# Patient Record
Sex: Female | Born: 1990 | Race: White | Hispanic: No | Marital: Married | Smoking: Never smoker
Health system: Southern US, Community
[De-identification: ages and names within clinical notes are randomized; demographics above are authoritative.]

## PROBLEM LIST (undated history)

## (undated) DIAGNOSIS — G4700 Insomnia, unspecified: Secondary | ICD-10-CM

## (undated) DIAGNOSIS — F429 Obsessive-compulsive disorder, unspecified: Secondary | ICD-10-CM

## (undated) DIAGNOSIS — R51 Headache: Secondary | ICD-10-CM

## (undated) DIAGNOSIS — F419 Anxiety disorder, unspecified: Secondary | ICD-10-CM

## (undated) HISTORY — DX: Headache: R51

## (undated) HISTORY — PX: WISDOM TOOTH EXTRACTION: SHX21

## (undated) HISTORY — DX: Anxiety disorder, unspecified: F41.9

## (undated) HISTORY — DX: Insomnia, unspecified: G47.00

## (undated) HISTORY — DX: Obsessive-compulsive disorder, unspecified: F42.9

## (undated) HISTORY — PX: TONSILLECTOMY: SHX5217

---

## 2012-07-14 ENCOUNTER — Encounter: Payer: Self-pay | Admitting: *Deleted

## 2012-07-18 ENCOUNTER — Encounter: Payer: Self-pay | Admitting: *Deleted

## 2012-07-27 ENCOUNTER — Ambulatory Visit: Payer: Self-pay | Admitting: Family Medicine

## 2012-07-27 ENCOUNTER — Ambulatory Visit (INDEPENDENT_AMBULATORY_CARE_PROVIDER_SITE_OTHER): Payer: PRIVATE HEALTH INSURANCE | Admitting: Family Medicine

## 2012-07-27 VITALS — BP 113/74 | HR 111 | Ht 61.0 in | Wt 94.0 lb

## 2012-07-27 DIAGNOSIS — F520 Hypoactive sexual desire disorder: Secondary | ICD-10-CM

## 2012-07-27 DIAGNOSIS — F411 Generalized anxiety disorder: Secondary | ICD-10-CM

## 2012-07-27 DIAGNOSIS — F429 Obsessive-compulsive disorder, unspecified: Secondary | ICD-10-CM

## 2012-07-27 MED ORDER — SERTRALINE HCL 50 MG PO TABS: 50.0000 mg | ORAL_TABLET | Freq: Every day | ORAL | Status: DC

## 2012-07-27 MED ORDER — ESCITALOPRAM OXALATE 10 MG PO TABS: 10.0000 mg | ORAL_TABLET | Freq: Every day | ORAL | Status: DC

## 2012-07-27 MED ORDER — ESCITALOPRAM 5 MG HALF TABLET: ORAL_TABLET | ORAL | Status: DC

## 2012-07-27 MED ORDER — BUPROPION HCL ER (XL) 150 MG PO TB24: 150.0000 mg | ORAL_TABLET | ORAL | Status: DC

## 2012-07-27 NOTE — Progress Notes (Signed)
Subjective:    Patient ID: Michelle [name], female    DOB: 05-25-1990, 22 y.o.   MRN: [id number]  HPI Michelle [name] is here today with her husband [name] to discuss her anxiety.  She was previously on Zoloft and did not like the sexual side effects so she stopped it and became very anxious.  She was then switched to Buspar which she definitely did not like so she went back on Zoloft.  She is currently taking 100 mg of Zoloft which helps her anxiety but her sexual dysfunction is back which she does not like/want.  She continues to be very worried/obsessed about exposure to blood.      Review of Systems  Constitutional: Negative.   HENT: Negative.   Eyes: Negative.   Respiratory: Negative.   Cardiovascular: Negative.   Gastrointestinal: Negative.   Genitourinary: Positive for genital sores.  Psychiatric/Behavioral: The patient is nervous/anxious.        Low Sexual Desire     Past Medical History  Diagnosis Date  . Anxiety   . Obsessive compulsive disorder   . Insomnia   . Headache    Family History  Problem Relation Age of Onset  . Depression Mother   . Anxiety disorder Mother   . Hyperlipidemia Father   . Hypertension Father   . Cancer Paternal Aunt 73    Colon Cancer   . Breast cancer Paternal Aunt   . Breast cancer Paternal Grandmother   . Cancer Paternal Grandfather     Leukemia   . 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 No distress.  HENT:  Head: Normocephalic.  Eyes: No scleral icterus.  Neck: No thyromegaly present.   Cardiovascular: Normal rate, regular rhythm and normal heart sounds.   Pulmonary/Chest: Effort normal and breath sounds normal.  Abdominal: There is no tenderness.  Musculoskeletal: She exhibits no edema and no tenderness.  Neurological: She is alert.  Skin: Skin is warm and dry.  Psychiatric: Her behavior is normal. Judgment normal.  She continues to be very worried about touching anything at work that she feels might be dried blood.  She remains very concerned that she might contract HIV/Hepatitis.        Assessment & Plan:   1)  Anxiety - We had a long discussion about her anxiety and her obsessions.  Her husband [name]) says that he certainly could tell a difference with her being on the Zoloft vs the Buspar which did not work well for her at all.  We are going to wean her down from Zoloft 100 mg and get her up to Lexapro 10 mg.    2)  Sex Drive -  She will address her sex drive after she is on the Lexapro for 2 weeks. If it continues to be low then she will add the Wellbutrin 150 mg in the am.    TIME 30 MINUTES:  MORE THAN 50 % OF TIME  WAS INVOLVED IN COUNSELING.

## 2012-07-27 NOTE — Patient Instructions (Addendum)
1)  Anxiety - Take 50 mg of Zoloft and 5 mg of Lexapro for one month then switch to 10 mg of Lexapro and stop the Zoloft.    2)  Sex Drive -  After you have been on the Lexapro 10 mg for 2 weeks then you can add the Wellbutrin 150 mg in the am.      Obsessive-Compulsive Disorder Obsessive-compulsive behavior is an anxiety disorder. The patient is constantly troubled by ideas that stay in the mind that cannot be ignored (obsessions). These troubling and sometimes bizarre thoughts compel one to behave in an unreasonable way. The patient will carry out repetitive, ritualistic acts (compulsions) to reduce anxiety. CAUSES The cause of obsessive-compulsive disorder (OCD) is not known. Scientists do know it runs in families. Men begin experiencing it in the teenage years. Women usually begin getting problems (symptoms) in their early 20's. Some studies have shown that the functioning of parts of the brain are different in those with OCD. The disorder may be closely associated with depression. SYMPTOMS  Persons with OCD recognize that their obsessions or compulsions prevent them from living a normal life. They commonly describe their behavior as foolish or pointless. But they cannot change it. Persons with OCD usually feel that their thoughts or obsessions are strange. They do not understand why they are having them. Sometimes the thoughts have to do with a fear that something terrible will happen or that they will do something terrible. Persons with OCD may spend hours each day performing senseless compulsive acts. The amount of time spent is less important than the degree of disruption caused in everyday life. EXAMPLES OF TYPICAL RITUALS  Cleaning. Fearing germs, a person may shower repeatedly throughout the entire day or wash his or her hands until they bleed.  Repeating. To dispel anxiety, a person may repeat a name or phrase many times.  Completing. A person may perform a series of complicated steps  in an exact order or repeat them until they are done perfectly.  Checking. A person may check things over and over to make sure a task has been completed. For example, repeatedly checking to see if the door is locked or the toaster is unplugged.  Hoarding. A person may constantly collect useless items that he or she repeatedly counts and stacks.  People with OCD may have emotional symptoms associated with depression. This may include guilt, low self-esteem, anxiety, and extreme fatigue. Many of these emotional problems are a result of the frustration brought on by an obsessive-compulsive problem.  Obsessive-compulsive symptoms will often create problems in relationships.  In extreme cases, people with OCD become totally disabled, have no friends, and are unable to leave home surroundings. They spend the day performing rituals or having obsessive thoughts. DIAGNOSIS  There is no laboratory test for OCD. It is diagnosed by your caregiver talking with you and someone close to you about your symptoms. Your caregiver will ask very specific questions about the type of obsessions or compulsions you have.   Your caregiver may diagnose obsessive-compulsive disorder if your obsessions or compulsions:  Cause you distress.  Take more than an hour of your time per day.  Interfere with your normal routine, occupation, social activities, or relationships with others. YOUR CAREGIVER MAY ASK YOU SUCH QUESTIONS AS:  Do you have troubling thoughts that you cannot dispel regardless of how hard you try?  Do you keep things extremely clean and wash your hands more than other people you know?  Do you  check things over and over, even though you know that the oven has been turned off or that the front door is locked? TREATMENT  A combination of antidepressant or anti-anxiety drugs and behavior therapy has been most helpful in treating the disorder. Clomipramine (Anafranil) is often used in the treatment of OCD.  Some drugs like Zoloft and Luvox have been used with positive results. In very rare cases, neurosurgery is performed. OCD is not obsession about life's normal worries. Your caregiver will have to make sure that a medication or drug is not adding to your symptoms. Phobias and longstanding (chronic) depression can also occur along with OCD.  HOW LONG WILL THE EFFECTS OF OBSESSIVE-COMPULSIVE DISORDER LAST? Obsessive-compulsive disorder usually appears in the late teens and early twenties. The disorder may last a lifetime without treatment. It may become less severe from time to time, but never quite goes away. You may become free of your symptoms for years before having a relapse. Developments in behavior therapy and new medications are helping many people with OCD live productive lives. HOME CARE INSTRUCTIONS   Include your family in your therapy. You and your family may benefit from reading books, studying OCD, and attending support groups.  Take your medication as ordered by your caregiver.  Do not miss your behavioral therapy sessions.  Know that you are not alone. There are millions of people affected by OCD. There are national organizations devoted to helping people with this disorder. SEEK IMMEDIATE MEDICAL CARE IF:  You feel that any of your ideas or actions are slipping out of your control. Document Released: 03/10/2001 Document Revised: 06/08/2011 Document Reviewed: 11/22/2007 [facility] Patient Information 2013 [place], [place].

## 2012-07-29 ENCOUNTER — Ambulatory Visit: Payer: Self-pay | Admitting: Family Medicine

## 2012-08-02 ENCOUNTER — Encounter: Payer: Self-pay | Admitting: Family Medicine

## 2012-08-02 DIAGNOSIS — F429 Obsessive-compulsive disorder, unspecified: Secondary | ICD-10-CM | POA: Insufficient documentation

## 2012-08-02 DIAGNOSIS — F520 Hypoactive sexual desire disorder: Secondary | ICD-10-CM | POA: Insufficient documentation

## 2012-08-02 DIAGNOSIS — F411 Generalized anxiety disorder: Secondary | ICD-10-CM | POA: Insufficient documentation

## 2012-08-26 ENCOUNTER — Ambulatory Visit: Payer: Self-pay | Admitting: Family Medicine

## 2012-08-26 DIAGNOSIS — Z0289 Encounter for other administrative examinations: Secondary | ICD-10-CM

## 2012-10-25 ENCOUNTER — Encounter: Payer: Self-pay | Admitting: Family Medicine

## 2012-10-25 ENCOUNTER — Ambulatory Visit (INDEPENDENT_AMBULATORY_CARE_PROVIDER_SITE_OTHER): Payer: PRIVATE HEALTH INSURANCE | Admitting: Family Medicine

## 2012-10-25 VITALS — BP 129/83 | HR 128 | Resp 16 | Ht 61.5 in | Wt 89.0 lb

## 2012-10-25 DIAGNOSIS — Z3041 Encounter for surveillance of contraceptive pills: Secondary | ICD-10-CM

## 2012-10-25 DIAGNOSIS — L981 Factitial dermatitis: Secondary | ICD-10-CM

## 2012-10-25 DIAGNOSIS — F411 Generalized anxiety disorder: Secondary | ICD-10-CM

## 2012-10-25 DIAGNOSIS — L739 Follicular disorder, unspecified: Secondary | ICD-10-CM

## 2012-10-25 DIAGNOSIS — F520 Hypoactive sexual desire disorder: Secondary | ICD-10-CM

## 2012-10-25 DIAGNOSIS — L738 Other specified follicular disorders: Secondary | ICD-10-CM

## 2012-10-25 MED ORDER — NORGESTIM-ETH ESTRAD TRIPHASIC 0.18/0.215/0.25 MG-35 MCG PO TABS: 1.0000 | ORAL_TABLET | Freq: Every day | ORAL | Status: DC

## 2012-10-25 MED ORDER — MUPIROCIN CALCIUM 2 % EX CREA: TOPICAL_CREAM | CUTANEOUS | Status: DC

## 2012-10-25 MED ORDER — BUPROPION HCL ER (XL) 150 MG PO TB24: 150.0000 mg | ORAL_TABLET | ORAL | Status: DC

## 2012-10-25 MED ORDER — SERTRALINE HCL 100 MG PO TABS: 100.0000 mg | ORAL_TABLET | Freq: Every day | ORAL | Status: DC

## 2012-10-25 MED ORDER — PERMETHRIN 5 % EX CREA: TOPICAL_CREAM | Freq: Once | CUTANEOUS | Status: DC

## 2012-10-25 MED ORDER — AZITHROMYCIN 500 MG PO TABS: ORAL_TABLET | ORAL | Status: DC

## 2012-10-25 MED ORDER — BETAMETHASONE VALERATE 0.12 % EX FOAM: 1.0000 "application " | Freq: Two times a day (BID) | CUTANEOUS | Status: DC

## 2012-10-25 NOTE — Progress Notes (Signed)
  Subjective:    Patient ID: Michelle [name], female    DOB: 1990-11-02, 22 y.o.   MRN: [id number]  HPI  Michelle [name] is here today to discuss the conditions listed below:    1)  Vaginal Bleeding:  She has been experiencing vaginal bleeding after intercourse for several weeks.  She is very concerned about this problem.    2)  Anxiety:  She is doing much better on her current dose of Zoloft.  She needs this medication refilled.    Review of Systems  HENT: Negative.   Eyes: Negative.   Respiratory: Negative.   Cardiovascular: Negative.   Gastrointestinal: Negative.   Endocrine: Negative.   Genitourinary: Positive for vaginal bleeding.  Musculoskeletal: Negative.   Skin: Negative.   Allergic/Immunologic: Negative.   Neurological: Negative.   Hematological: Negative.   Psychiatric/Behavioral: Negative.     Past Medical History  Diagnosis Date  . Anxiety   . Obsessive compulsive disorder   . Insomnia   . Headache(784.0)     Family History  Problem Relation Age of Onset  . Depression Mother   . Anxiety disorder Mother   . Hyperlipidemia Father   . Hypertension Father   . Cancer Paternal Aunt 4    Colon Cancer   . Breast cancer Paternal Aunt   . Breast cancer Paternal Grandmother   . Cancer Paternal Grandfather     Leukemia   . 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 She appears well-nourished. No distress.  Eyes: Conjunctivae are normal. No scleral icterus.  Neck: Neck supple. No thyromegaly  present.  Cardiovascular: Normal rate, regular rhythm and normal heart sounds.   Pulmonary/Chest: Breath sounds normal. No respiratory distress.  Genitourinary: Uterus normal. There is no tenderness or lesion on the right labia. There is no tenderness or lesion on the left labia. Cervix exhibits no motion tenderness, no discharge and no friability. Right adnexum displays no mass, no tenderness and no fullness. Left adnexum displays no mass, no tenderness and no fullness. No erythema, tenderness or bleeding around the vagina. No vaginal discharge found.  Neurological: She is alert and oriented to person, place, and time.  Skin: Skin is warm and dry. Lesion noted.  Both of her lower legs are covered in sores.    Psychiatric:  She is anxious but less than she was at her last visit.            Assessment & Plan:

## 2012-10-25 NOTE — Patient Instructions (Addendum)
1)  Vaginal Beeding - This is most likely due to irritation.  We are treating you with a medication that will cover atypical bacteria that can cause cervicitis.    2)  Skin - Zithromax plus Luxiq.  You may also want to add some Zyrtec at night.  If you don't improve then you could try some Elimite .   3)  Birth Control Pills - The Ortho Tri Cyclen might have less effect on libido if you want to try this pill.  4)  Anxiety - Stay on the Zoloft. You can add the Wellbutrin which might also decrease the sexual dysfunction.      Folliculitis  Folliculitis is redness, soreness, and swelling (inflammation) of the hair follicles. This condition can occur anywhere on the body. People with weakened immune systems, diabetes, or obesity have a greater risk of getting folliculitis. CAUSES  Bacterial infection. This is the most common cause.  Fungal infection.  Viral infection.  Contact with certain chemicals, especially oils and tars. Long-term folliculitis can result from bacteria that live in the nostrils. The bacteria may trigger multiple outbreaks of folliculitis over time. SYMPTOMS Folliculitis most commonly occurs on the scalp, thighs, legs, back, buttocks, and areas where hair is shaved frequently. An early sign of folliculitis is a small, white or yellow, pus-filled, itchy lesion (pustule). These lesions appear on a red, inflamed follicle. They are usually less than 0.2 inches (5 mm) wide. When there is an infection of the follicle that goes deeper, it becomes a boil or furuncle. A group of closely packed boils creates a larger lesion (carbuncle). Carbuncles tend to occur in hairy, sweaty areas of the body. DIAGNOSIS  Your caregiver can usually tell what is wrong by doing a physical exam. A sample may be taken from one of the lesions and tested in a lab. This can help determine what is causing your folliculitis. TREATMENT  Treatment may include:  Applying warm compresses to the affected  areas.  Taking antibiotic medicines orally or applying them to the skin.  Draining the lesions if they contain a large amount of pus or fluid.  Laser hair removal for cases of long-lasting folliculitis. This helps to prevent regrowth of the hair. HOME CARE INSTRUCTIONS  Apply warm compresses to the affected areas as directed by your caregiver.  If antibiotics are prescribed, take them as directed. Finish them even if you start to feel better.  You may take over-the-counter medicines to relieve itching.  Do not shave irritated skin.  Follow up with your caregiver as directed. SEEK IMMEDIATE MEDICAL CARE IF:   You have increasing redness, swelling, or pain in the affected area.  You have a fever. MAKE SURE YOU:  Understand these instructions.  Will watch your condition.  Will get help right away if you are not doing well or get worse. Document Released: 05/25/2001 Document Revised: 09/15/2011 Document Reviewed: 06/16/2011 [facility] Patient Information 2014 [place], [place].

## 2012-12-04 DIAGNOSIS — Z3041 Encounter for surveillance of contraceptive pills: Secondary | ICD-10-CM | POA: Insufficient documentation

## 2012-12-04 DIAGNOSIS — L739 Follicular disorder, unspecified: Secondary | ICD-10-CM | POA: Insufficient documentation

## 2012-12-04 DIAGNOSIS — R6882 Decreased libido: Secondary | ICD-10-CM | POA: Insufficient documentation

## 2012-12-04 DIAGNOSIS — F520 Hypoactive sexual desire disorder: Secondary | ICD-10-CM | POA: Insufficient documentation

## 2012-12-04 DIAGNOSIS — L981 Factitial dermatitis: Secondary | ICD-10-CM | POA: Insufficient documentation

## 2012-12-04 NOTE — Assessment & Plan Note (Signed)
Michelle [name] admits to picking at her skin which is what I feel is going on with her labs.

## 2012-12-04 NOTE — Assessment & Plan Note (Deleted)
We'll see if Wellbutrin will counteract the sexual dysfunction that Zoloft causes.

## 2012-12-04 NOTE — Assessment & Plan Note (Signed)
We'll see if Wellbutrin will counteract the sexual dysfunction that Zoloft causes.   

## 2012-12-04 NOTE — Assessment & Plan Note (Signed)
She was given a prescription for Zithromax to help her cervicitis and Bactroban.

## 2012-12-04 NOTE — Assessment & Plan Note (Signed)
We'll switch her to Ortho Tri-Cyclen to see if this will decrease her sexual dysfunction.

## 2012-12-04 NOTE — Assessment & Plan Note (Signed)
Her anxiety is better on Zoloft.  She will remain on the 100 mg.

## 2013-01-26 ENCOUNTER — Encounter: Payer: Self-pay | Admitting: Family Medicine

## 2013-01-27 ENCOUNTER — Ambulatory Visit (INDEPENDENT_AMBULATORY_CARE_PROVIDER_SITE_OTHER): Payer: PRIVATE HEALTH INSURANCE | Admitting: Family Medicine

## 2013-01-27 ENCOUNTER — Other Ambulatory Visit (HOSPITAL_COMMUNITY)
Admission: RE | Admit: 2013-01-27 | Discharge: 2013-01-27 | Disposition: A | Discharge status: Home / Self Care | Payer: PRIVATE HEALTH INSURANCE | Source: Ambulatory Visit | Attending: Family Medicine | Admitting: Family Medicine

## 2013-01-27 ENCOUNTER — Encounter: Payer: Self-pay | Admitting: Family Medicine

## 2013-01-27 ENCOUNTER — Encounter (INDEPENDENT_AMBULATORY_CARE_PROVIDER_SITE_OTHER): Payer: Self-pay

## 2013-01-27 VITALS — BP 127/68 | HR 111 | Resp 18 | Ht 62.5 in | Wt 87.0 lb

## 2013-01-27 DIAGNOSIS — Z01419 Encounter for gynecological examination (general) (routine) without abnormal findings: Secondary | ICD-10-CM | POA: Insufficient documentation

## 2013-01-27 DIAGNOSIS — F520 Hypoactive sexual desire disorder: Secondary | ICD-10-CM

## 2013-01-27 DIAGNOSIS — Z3041 Encounter for surveillance of contraceptive pills: Secondary | ICD-10-CM

## 2013-01-27 DIAGNOSIS — F411 Generalized anxiety disorder: Secondary | ICD-10-CM

## 2013-01-27 DIAGNOSIS — Z0000 Encounter for general adult medical examination without abnormal findings: Secondary | ICD-10-CM

## 2013-01-27 DIAGNOSIS — Z124 Encounter for screening for malignant neoplasm of cervix: Secondary | ICD-10-CM

## 2013-01-27 LAB — POCT URINALYSIS DIPSTICK

## 2013-01-27 MED ORDER — NORGESTIM-ETH ESTRAD TRIPHASIC 0.18/0.215/0.25 MG-35 MCG PO TABS: 1.0000 | ORAL_TABLET | Freq: Every day | ORAL | Status: DC

## 2013-01-27 MED ORDER — BUPROPION HCL ER (XL) 150 MG PO TB24: 150.0000 mg | ORAL_TABLET | ORAL | Status: DC

## 2013-01-27 MED ORDER — SERTRALINE HCL 100 MG PO TABS: 100.0000 mg | ORAL_TABLET | Freq: Every day | ORAL | Status: DC

## 2013-01-27 NOTE — Patient Instructions (Signed)
Preventive Care for Adults, Female A healthy lifestyle and preventive care can promote health and wellness. Preventive health guidelines for women include the following key practices.  A routine yearly physical is a good way to check with your caregiver about your health and preventive screening. It is a chance to share any concerns and updates on your health, and to receive a thorough exam.  Visit your dentist for a routine exam and preventive care every 6 months. Brush your teeth twice a day and floss once a day. Good oral hygiene prevents tooth decay and gum disease.  The frequency of eye exams is based on your age, health, family medical history, use of contact lenses, and other factors. Follow your caregiver's recommendations for frequency of eye exams.  Eat a healthy diet. Foods like vegetables, fruits, whole grains, low-fat dairy products, and lean protein foods contain the nutrients you need without too many calories. Decrease your intake of foods high in solid fats, added sugars, and salt. Eat the right amount of calories for you.Get information about a proper diet from your caregiver, if necessary.  Regular physical exercise is one of the most important things you can do for your health. Most adults should get at least 150 minutes of moderate-intensity exercise (any activity that increases your heart rate and causes you to sweat) each week. In addition, most adults need muscle-strengthening exercises on 2 or more days a week.  Maintain a healthy weight. The body mass index (BMI) is a screening tool to identify possible weight problems. It provides an estimate of body fat based on height and weight. Your caregiver can help determine your BMI, and can help you achieve or maintain a healthy weight.For adults 20 years and older:  A BMI below 18.5 is considered underweight.  A BMI of 18.5 to 24.9 is normal.  A BMI of 25 to 29.9 is considered overweight.  A BMI of 30 and above is  considered obese.  Maintain normal blood lipids and cholesterol levels by exercising and minimizing your intake of saturated fat. Eat a balanced diet with plenty of fruit and vegetables. Blood tests for lipids and cholesterol should begin at age 20 and be repeated every 5 years. If your lipid or cholesterol levels are high, you are over 50, or you are at high risk for heart disease, you may need your cholesterol levels checked more frequently.Ongoing high lipid and cholesterol levels should be treated with medicines if diet and exercise are not effective.  If you smoke, find out from your caregiver how to quit. If you do not use tobacco, do not start.  If you are pregnant, do not drink alcohol. If you are breastfeeding, be very cautious about drinking alcohol. If you are not pregnant and choose to drink alcohol, do not exceed 1 drink per day. One drink is considered to be 12 ounces (355 mL) of beer, 5 ounces (148 mL) of wine, or 1.5 ounces (44 mL) of liquor.  Avoid use of street drugs. Do not share needles with anyone. Ask for help if you need support or instructions about stopping the use of drugs.  High blood pressure causes heart disease and increases the risk of stroke. Your blood pressure should be checked at least every 1 to 2 years. Ongoing high blood pressure should be treated with medicines if weight loss and exercise are not effective.  If you are 55 to 23 years old, ask your caregiver if you should take aspirin to prevent strokes.  Diabetes   screening involves taking a blood sample to check your fasting blood sugar level. This should be done once every 3 years, after age 45, if you are within normal weight and without risk factors for diabetes. Testing should be considered at a younger age or be carried out more frequently if you are overweight and have at least 1 risk factor for diabetes.  Breast cancer screening is essential preventive care for women. You should practice "breast  self-awareness." This means understanding the normal appearance and feel of your breasts and may include breast self-examination. Any changes detected, no matter how small, should be reported to a caregiver. Women in their 20s and 30s should have a clinical breast exam (CBE) by a caregiver as part of a regular health exam every 1 to 3 years. After age 40, women should have a CBE every year. Starting at age 40, women should consider having a mammography (breast X-ray test) every year. Women who have a family history of breast cancer should talk to their caregiver about genetic screening. Women at a high risk of breast cancer should talk to their caregivers about having magnetic resonance imaging (MRI) and a mammography every year.  The Pap test is a screening test for cervical cancer. A Pap test can show cell changes on the cervix that might become cervical cancer if left untreated. A Pap test is a procedure in which cells are obtained and examined from the lower end of the uterus (cervix).  Women should have a Pap test starting at age 21.  Between ages 21 and 29, Pap tests should be repeated every 2 years.  Beginning at age 30, you should have a Pap test every 3 years as long as the past 3 Pap tests have been normal.  Some women have medical problems that increase the chance of getting cervical cancer. Talk to your caregiver about these problems. It is especially important to talk to your caregiver if a new problem develops soon after your last Pap test. In these cases, your caregiver may recommend more frequent screening and Pap tests.  The above recommendations are the same for women who have or have not gotten the vaccine for human papillomavirus (HPV).  If you had a hysterectomy for a problem that was not cancer or a condition that could lead to cancer, then you no longer need Pap tests. Even if you no longer need a Pap test, a regular exam is a good idea to make sure no other problems are  starting.  If you are between ages 65 and 70, and you have had normal Pap tests going back 10 years, you no longer need Pap tests. Even if you no longer need a Pap test, a regular exam is a good idea to make sure no other problems are starting.  If you have had past treatment for cervical cancer or a condition that could lead to cancer, you need Pap tests and screening for cancer for at least 20 years after your treatment.  If Pap tests have been discontinued, risk factors (such as a new sexual partner) need to be reassessed to determine if screening should be resumed.  The HPV test is an additional test that may be used for cervical cancer screening. The HPV test looks for the virus that can cause the cell changes on the cervix. The cells collected during the Pap test can be tested for HPV. The HPV test could be used to screen women aged 30 years and older, and should   be used in women of any age who have unclear Pap test results. After the age of 30, women should have HPV testing at the same frequency as a Pap test.  Colorectal cancer can be detected and often prevented. Most routine colorectal cancer screening begins at the age of 50 and continues through age 75. However, your caregiver may recommend screening at an earlier age if you have risk factors for colon cancer. On a yearly basis, your caregiver may provide home test kits to check for hidden blood in the stool. Use of a small camera at the end of a tube, to directly examine the colon (sigmoidoscopy or colonoscopy), can detect the earliest forms of colorectal cancer. Talk to your caregiver about this at age 50, when routine screening begins. Direct examination of the colon should be repeated every 5 to 10 years through age 75, unless early forms of pre-cancerous polyps or small growths are found.  Hepatitis C blood testing is recommended for all people born from 1945 through 1965 and any individual with known risks for hepatitis C.  Practice  safe sex. Use condoms and avoid high-risk sexual practices to reduce the spread of sexually transmitted infections (STIs). STIs include gonorrhea, chlamydia, syphilis, trichomonas, herpes, HPV, and human immunodeficiency virus (HIV). Herpes, HIV, and HPV are viral illnesses that have no cure. They can result in disability, cancer, and death. Sexually active women aged 25 and younger should be checked for chlamydia. Older women with new or multiple partners should also be tested for chlamydia. Testing for other STIs is recommended if you are sexually active and at increased risk.  Osteoporosis is a disease in which the bones lose minerals and strength with aging. This can result in serious bone fractures. The risk of osteoporosis can be identified using a bone density scan. Women ages 65 and over and women at risk for fractures or osteoporosis should discuss screening with their caregivers. Ask your caregiver whether you should take a calcium supplement or vitamin D to reduce the rate of osteoporosis.  Menopause can be associated with physical symptoms and risks. Hormone replacement therapy is available to decrease symptoms and risks. You should talk to your caregiver about whether hormone replacement therapy is right for you.  Use sunscreen with sun protection factor (SPF) of 30 or more. Apply sunscreen liberally and repeatedly throughout the day. You should seek shade when your shadow is shorter than you. Protect yourself by wearing long sleeves, pants, a wide-brimmed hat, and sunglasses year round, whenever you are outdoors.  Once a month, do a whole body skin exam, using a mirror to look at the skin on your back. Notify your caregiver of new moles, moles that have irregular borders, moles that are larger than a pencil eraser, or moles that have changed in shape or color.  Stay current with required immunizations.  Influenza. You need a dose every fall (or winter). The composition of the flu vaccine  changes each year, so being vaccinated once is not enough.  Pneumococcal polysaccharide. You need 1 to 2 doses if you smoke cigarettes or if you have certain chronic medical conditions. You need 1 dose at age 65 (or older) if you have never been vaccinated.  Tetanus, diphtheria, pertussis (Tdap, Td). Get 1 dose of Tdap vaccine if you are younger than age 65, are over 65 and have contact with an infant, are a healthcare worker, are pregnant, or simply want to be protected from whooping cough. After that, you need a Td   booster dose every 10 years. Consult your caregiver if you have not had at least 3 tetanus and diphtheria-containing shots sometime in your life or have a deep or dirty wound.  HPV. You need this vaccine if you are a woman age 26 or younger. The vaccine is given in 3 doses over 6 months.  Measles, mumps, rubella (MMR). You need at least 1 dose of MMR if you were born in 1957 or later. You may also need a second dose.  Meningococcal. If you are age 19 to 21 and a first-year college student living in a residence hall, or have one of several medical conditions, you need to get vaccinated against meningococcal disease. You may also need additional booster doses.  Zoster (shingles). If you are age 60 or older, you should get this vaccine.  Varicella (chickenpox). If you have never had chickenpox or you were vaccinated but received only 1 dose, talk to your caregiver to find out if you need this vaccine.  Hepatitis A. You need this vaccine if you have a specific risk factor for hepatitis A virus infection or you simply wish to be protected from this disease. The vaccine is usually given as 2 doses, 6 to 18 months apart.  Hepatitis B. You need this vaccine if you have a specific risk factor for hepatitis B virus infection or you simply wish to be protected from this disease. The vaccine is given in 3 doses, usually over 6 months. Preventive Services / Frequency Ages 19 to 39  Blood  pressure check.** / Every 1 to 2 years.  Lipid and cholesterol check.** / Every 5 years beginning at age 20.  Clinical breast exam.** / Every 3 years for women in their 20s and 30s.  Pap test.** / Every 2 years from ages 21 through 29. Every 3 years starting at age 30 through age 65 or 70 with a history of 3 consecutive normal Pap tests.  HPV screening.** / Every 3 years from ages 30 through ages 65 to 70 with a history of 3 consecutive normal Pap tests.  Hepatitis C blood test.** / For any individual with known risks for hepatitis C.  Skin self-exam. / Monthly.  Influenza immunization.** / Every year.  Pneumococcal polysaccharide immunization.** / 1 to 2 doses if you smoke cigarettes or if you have certain chronic medical conditions.  Tetanus, diphtheria, pertussis (Tdap, Td) immunization. / A one-time dose of Tdap vaccine. After that, you need a Td booster dose every 10 years.  HPV immunization. / 3 doses over 6 months, if you are 26 and younger.  Measles, mumps, rubella (MMR) immunization. / You need at least 1 dose of MMR if you were born in 1957 or later. You may also need a second dose.  Meningococcal immunization. / 1 dose if you are age 19 to 21 and a first-year college student living in a residence hall, or have one of several medical conditions, you need to get vaccinated against meningococcal disease. You may also need additional booster doses.  Varicella immunization.** / Consult your caregiver.  Hepatitis A immunization.** / Consult your caregiver. 2 doses, 6 to 18 months apart.  Hepatitis B immunization.** / Consult your caregiver. 3 doses usually over 6 months. Ages 40 to 64  Blood pressure check.** / Every 1 to 2 years.  Lipid and cholesterol check.** / Every 5 years beginning at age 20.  Clinical breast exam.** / Every year after age 40.  Mammogram.** / Every year beginning at age 40   and continuing for as long as you are in good health. Consult with your  caregiver.  Pap test.** / Every 3 years starting at age 30 through age 65 or 70 with a history of 3 consecutive normal Pap tests.  HPV screening.** / Every 3 years from ages 30 through ages 65 to 70 with a history of 3 consecutive normal Pap tests.  Fecal occult blood test (FOBT) of stool. / Every year beginning at age 50 and continuing until age 75. You may not need to do this test if you get a colonoscopy every 10 years.  Flexible sigmoidoscopy or colonoscopy.** / Every 5 years for a flexible sigmoidoscopy or every 10 years for a colonoscopy beginning at age 50 and continuing until age 75.  Hepatitis C blood test.** / For all people born from 1945 through 1965 and any individual with known risks for hepatitis C.  Skin self-exam. / Monthly.  Influenza immunization.** / Every year.  Pneumococcal polysaccharide immunization.** / 1 to 2 doses if you smoke cigarettes or if you have certain chronic medical conditions.  Tetanus, diphtheria, pertussis (Tdap, Td) immunization.** / A one-time dose of Tdap vaccine. After that, you need a Td booster dose every 10 years.  Measles, mumps, rubella (MMR) immunization. / You need at least 1 dose of MMR if you were born in 1957 or later. You may also need a second dose.  Varicella immunization.** / Consult your caregiver.  Meningococcal immunization.** / Consult your caregiver.  Hepatitis A immunization.** / Consult your caregiver. 2 doses, 6 to 18 months apart.  Hepatitis B immunization.** / Consult your caregiver. 3 doses, usually over 6 months. Ages 65 and over  Blood pressure check.** / Every 1 to 2 years.  Lipid and cholesterol check.** / Every 5 years beginning at age 20.  Clinical breast exam.** / Every year after age 40.  Mammogram.** / Every year beginning at age 40 and continuing for as long as you are in good health. Consult with your caregiver.  Pap test.** / Every 3 years starting at age 30 through age 65 or 70 with a 3  consecutive normal Pap tests. Testing can be stopped between 65 and 70 with 3 consecutive normal Pap tests and no abnormal Pap or HPV tests in the past 10 years.  HPV screening.** / Every 3 years from ages 30 through ages 65 or 70 with a history of 3 consecutive normal Pap tests. Testing can be stopped between 65 and 70 with 3 consecutive normal Pap tests and no abnormal Pap or HPV tests in the past 10 years.  Fecal occult blood test (FOBT) of stool. / Every year beginning at age 50 and continuing until age 75. You may not need to do this test if you get a colonoscopy every 10 years.  Flexible sigmoidoscopy or colonoscopy.** / Every 5 years for a flexible sigmoidoscopy or every 10 years for a colonoscopy beginning at age 50 and continuing until age 75.  Hepatitis C blood test.** / For all people born from 1945 through 1965 and any individual with known risks for hepatitis C.  Osteoporosis screening.** / A one-time screening for women ages 65 and over and women at risk for fractures or osteoporosis.  Skin self-exam. / Monthly.  Influenza immunization.** / Every year.  Pneumococcal polysaccharide immunization.** / 1 dose at age 65 (or older) if you have never been vaccinated.  Tetanus, diphtheria, pertussis (Tdap, Td) immunization. / A one-time dose of Tdap vaccine if you are over   65 and have contact with an infant, are a healthcare worker, or simply want to be protected from whooping cough. After that, you need a Td booster dose every 10 years.  Varicella immunization.** / Consult your caregiver.  Meningococcal immunization.** / Consult your caregiver.  Hepatitis A immunization.** / Consult your caregiver. 2 doses, 6 to 18 months apart.  Hepatitis B immunization.** / Check with your caregiver. 3 doses, usually over 6 months. ** Family history and personal history of risk and conditions may change your caregiver's recommendations. Document Released: 05/12/2001 Document Revised: 06/08/2011  Document Reviewed: 08/11/2010 [employer] Patient Information 2014 [employer].  

## 2013-01-27 NOTE — Progress Notes (Signed)
Subjective:    Patient ID: Michelle [name], female    DOB: Feb 09, 1991, 22 y.o.   MRN: [id number]  HPI  Michelle [name] is here today for her annual CPE with pap.  She also would like to discuss the condition listed below:    1)  Nasal Congestion:  She has been struggling with seasonal allergies.  She has not taken any medications for these symptoms.    2)  Birth Control:  She is doing much better with her norethindrone-ethynil estradiol.     3)  Mood:  She is doing well with the combination of Zoloft and Wellbutrin.     Review of Systems  Constitutional: Negative.   HENT: Negative.   Eyes: Negative.   Respiratory: Negative.   Cardiovascular: Negative.   Gastrointestinal: Negative.   Endocrine: Negative.   Genitourinary: Negative.   Musculoskeletal: Negative.   Skin: Negative.   Allergic/Immunologic: Negative.   Neurological: Negative.   Hematological: Negative.   Psychiatric/Behavioral: Negative.      Past Medical History  Diagnosis Date  . Anxiety   . Obsessive compulsive disorder   . Insomnia   . Headache(784.0)      Family History  Problem Relation Age of Onset  . Depression Mother   . Anxiety disorder Mother   . Hyperlipidemia Father   . Hypertension Father   . Cancer Paternal Aunt 95    Colon Cancer   . Breast cancer Paternal Aunt   . Breast cancer Paternal Grandmother   . Cancer Paternal Grandfather     Leukemia   . 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 Constitutional: She is oriented to person, place, and time. She appears  well-developed and well-nourished.  HENT:  Head: Normocephalic and atraumatic.  Right Ear: External ear normal.  Left Ear: External ear normal.  Nose: Nose normal.  Mouth/Throat: Oropharynx is clear and moist.  Eyes: Conjunctivae and EOM are normal. Pupils are equal, round, and reactive to light.  Neck: Normal range of motion. No thyromegaly present.  Cardiovascular: Normal rate, regular rhythm, normal heart sounds and intact distal pulses.  Exam reveals no gallop and no friction rub.   No murmur heard. Pulmonary/Chest: Effort normal and breath sounds normal. Right breast exhibits no inverted nipple, no mass, no nipple discharge, no skin change and no tenderness. Left breast exhibits no inverted nipple, no mass, no nipple discharge, no skin change and no tenderness. Breasts are symmetrical.  Abdominal: Soft. Bowel sounds are normal. Hernia confirmed negative in the right inguinal area and confirmed negative in the left inguinal area.  Genitourinary: Vagina normal and uterus normal. Pelvic exam was performed with patient supine. There is no rash, tenderness or lesion on the right labia. There is no rash, tenderness or lesion on the left labia. No vaginal discharge found.  Musculoskeletal: Normal range of motion. She exhibits no edema and no tenderness.  Lymphadenopathy:    She has no cervical adenopathy.  Right: No inguinal adenopathy present.       Left: No inguinal adenopathy present.  Neurological: She is alert and oriented to person, place, and time. She has normal reflexes.  Skin: Skin is warm and dry.  Psychiatric: She has a normal mood and affect. Her behavior is normal. Judgment and thought content normal.          Assessment & Plan:

## 2013-01-29 DIAGNOSIS — Z124 Encounter for screening for malignant neoplasm of cervix: Secondary | ICD-10-CM | POA: Insufficient documentation

## 2013-01-29 DIAGNOSIS — Z0000 Encounter for general adult medical examination without abnormal findings: Secondary | ICD-10-CM | POA: Insufficient documentation

## 2013-01-29 NOTE — Assessment & Plan Note (Signed)
Refilled her Zoloft  

## 2013-01-29 NOTE — Assessment & Plan Note (Signed)
We discussed that Ortho Tri-Cyclen might also help with her sex drive.  She will decide if she wants to stay on the Loestrin or try the Ortho Tri Cyclen.

## 2013-01-29 NOTE — Assessment & Plan Note (Signed)
Normal exam.

## 2013-01-29 NOTE — Assessment & Plan Note (Signed)
A pap was done without difficulty.  We're checking for high risk HPV with reflex to 16/18 if positive.   

## 2013-01-29 NOTE — Assessment & Plan Note (Signed)
This has improved since adding the Wellbutrin.  She will continue on the combination of Wellbutrin and Zoloft.

## 2013-01-30 ENCOUNTER — Encounter: Payer: Self-pay | Admitting: *Deleted

## 2013-03-17 ENCOUNTER — Encounter: Payer: Self-pay | Admitting: Family Medicine

## 2013-03-17 ENCOUNTER — Ambulatory Visit (INDEPENDENT_AMBULATORY_CARE_PROVIDER_SITE_OTHER): Payer: PRIVATE HEALTH INSURANCE | Admitting: Family Medicine

## 2013-03-17 VITALS — BP 111/74 | HR 125 | Resp 16 | Wt 88.0 lb

## 2013-03-17 DIAGNOSIS — F520 Hypoactive sexual desire disorder: Secondary | ICD-10-CM

## 2013-03-17 DIAGNOSIS — Z3041 Encounter for surveillance of contraceptive pills: Secondary | ICD-10-CM

## 2013-03-17 DIAGNOSIS — F411 Generalized anxiety disorder: Secondary | ICD-10-CM

## 2013-03-17 MED ORDER — NORETHIN ACE-ETH ESTRAD-FE 1-20 MG-MCG PO TABS: 1.0000 | ORAL_TABLET | Freq: Every day | ORAL | Status: DC

## 2013-03-17 MED ORDER — SERTRALINE HCL 100 MG PO TABS: 100.0000 mg | ORAL_TABLET | Freq: Every day | ORAL | Status: DC

## 2013-03-17 MED ORDER — BUPROPION HCL ER (XL) 150 MG PO TB24: 150.0000 mg | ORAL_TABLET | ORAL | Status: DC

## 2013-03-17 NOTE — Progress Notes (Signed)
   Subjective:    Patient ID: Michelle [name], female    DOB: January 05, 1991, 22 y.o.   MRN: [id number]  HPI  [name] is here today to discuss her current treatment for anxiety.  She was doing well on the combination of Zoloft and Wellbutrin but she feels that her anxiety level has increased since she switched her birth control.  She also feels that her menstrual cycles is heavier and her cramps are also worse so she would like to get back on Gildess which she feels worked better for her.     Review of Systems  Genitourinary: Positive for menstrual problem.  Psychiatric/Behavioral: The patient is nervous/anxious.      Past Medical History  Diagnosis Date  . Anxiety   . Obsessive compulsive disorder   . Insomnia   . NWGNFAOZ(308.6)      Past Surgical History  Procedure Laterality Date  . 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 Depression Mother   . Anxiety disorder Mother   . Hyperlipidemia Father   . Hypertension Father   . Cancer Paternal Aunt 63    Colon Cancer   . Breast cancer Paternal Aunt   . Breast cancer Paternal Grandmother   . Cancer Paternal Grandfather     Leukemia   . Hypertension Paternal Grandfather   . Drug abuse Paternal Uncle      No Known Allergies      Objective:   Physical Exam  Vitals reviewed. Constitutional: She is oriented to person, place, and time. She appears well-nourished. No distress.  Eyes: Conjunctivae are normal. No scleral icterus.  Neck: Neck supple. No thyromegaly present.  Cardiovascular: Normal rate, regular  rhythm and normal heart sounds.   Pulmonary/Chest: Breath sounds normal. No respiratory distress.  Neurological: She is alert and oriented to person, place, and time.  Skin: Skin is warm and dry.  Psychiatric: She has a normal mood and affect. Her behavior is normal. Judgment and thought content normal.  Anxious      Assessment & Plan:    Keniesha was seen today for anxiety.  Diagnoses and associated orders for this visit:  Uses oral contraception - norethindrone-ethinyl estradiol (JUNEL FE,GILDESS FE,LOESTRIN FE) 1-20 MG-MCG tablet; Take 1 tablet by mouth daily.  Anxiety state, unspecified - sertraline (ZOLOFT) 100 MG tablet; Take 1 tablet (100 mg total) by mouth daily.  Hypoactive sexual desire - buPROPion (WELLBUTRIN XL) 150 MG 24 hr tablet; Take 1 tablet (150 mg total) by mouth every morning.

## 2013-03-19 ENCOUNTER — Other Ambulatory Visit: Payer: Self-pay | Admitting: Family Medicine

## 2013-06-23 ENCOUNTER — Other Ambulatory Visit: Payer: Self-pay | Admitting: Family Medicine

## 2013-07-17 ENCOUNTER — Other Ambulatory Visit: Payer: Self-pay | Admitting: *Deleted

## 2013-07-17 ENCOUNTER — Other Ambulatory Visit: Payer: Self-pay | Admitting: Family Medicine

## 2013-07-17 DIAGNOSIS — F411 Generalized anxiety disorder: Secondary | ICD-10-CM

## 2013-07-17 MED ORDER — SERTRALINE HCL 100 MG PO TABS: 100.0000 mg | ORAL_TABLET | Freq: Every day | ORAL | Status: DC

## 2013-07-17 MED ORDER — BUPROPION HCL ER (XL) 150 MG PO TB24: 150.0000 mg | ORAL_TABLET | Freq: Every day | ORAL | Status: DC

## 2013-07-26 ENCOUNTER — Ambulatory Visit: Payer: PRIVATE HEALTH INSURANCE | Admitting: Family Medicine

## 2013-08-14 ENCOUNTER — Encounter: Payer: Self-pay | Admitting: Family Medicine

## 2013-08-14 ENCOUNTER — Ambulatory Visit (INDEPENDENT_AMBULATORY_CARE_PROVIDER_SITE_OTHER): Payer: PRIVATE HEALTH INSURANCE | Admitting: Family Medicine

## 2013-08-14 VITALS — BP 117/82 | HR 107 | Resp 16 | Ht 61.0 in | Wt 88.0 lb

## 2013-08-14 DIAGNOSIS — R000 Tachycardia, unspecified: Secondary | ICD-10-CM

## 2013-08-14 DIAGNOSIS — K625 Hemorrhage of anus and rectum: Secondary | ICD-10-CM

## 2013-08-14 DIAGNOSIS — F411 Generalized anxiety disorder: Secondary | ICD-10-CM

## 2013-08-14 DIAGNOSIS — R636 Underweight: Secondary | ICD-10-CM

## 2013-08-14 MED ORDER — SERTRALINE HCL 100 MG PO TABS: 100.0000 mg | ORAL_TABLET | Freq: Every day | ORAL | Status: AC

## 2013-08-14 MED ORDER — BUPROPION HCL ER (XL) 150 MG PO TB24: 150.0000 mg | ORAL_TABLET | Freq: Every day | ORAL | Status: DC

## 2013-08-14 NOTE — Patient Instructions (Addendum)
1)  Anxiety - Continue on the combination of Wellbutrin and Zoloft.   2)  Tachycardia - Check your pulse when you are not at the doctor's office.  You can do this manually or get a pulse ox ([employer]) to check it for you.   3)  Stool ? -  You can complete stool cards and return for a check.    4)  Weight - If you want to gain 1 lb per week, increase your weekly intake of calories by 3500 (500 per day) vs 1/2 lb (250 per day)     Nonspecific Tachycardia Tachycardia is a faster than normal heartbeat (more than 100 beats per minute). In adults, the heart normally beats between 60 and 100 times a minute. A fast heartbeat may be a normal response to exercise or stress. It does not necessarily mean that something is wrong. However, sometimes when your heart beats too fast it may not be able to pump enough blood to the rest of your body. This can result in chest pain, shortness of breath, dizziness, and even fainting. Nonspecific tachycardia means that the specific cause or pattern of your tachycardia is unknown. CAUSES  Tachycardia may be harmless or it may be due to a more serious underlying cause. Possible causes of tachycardia include:  Exercise or exertion.  Fever.  Pain or injury.  Infection.  Loss of body fluids (dehydration).  Overactive thyroid.  Lack of red blood cells (anemia).  Anxiety and stress.  Alcohol.  Caffeine.  Tobacco products.  Diet pills.  Illegal drugs.  Heart disease. SYMPTOMS  Rapid or irregular heartbeat (palpitations).  Suddenly feeling your heart beating (cardiac awareness).  Dizziness.  Tiredness (fatigue).  Shortness of breath.  Chest pain.  Nausea.  Fainting. DIAGNOSIS  Your caregiver will perform a physical exam and take your medical history. In some cases, a heart specialist (cardiologist) may be consulted. Your caregiver may also order:  Blood tests.  Electrocardiography. This test records the electrical activity of your  heart.  A heart monitoring test. TREATMENT  Treatment will depend on the likely cause of your tachycardia. The goal is to treat the underlying cause of your tachycardia. Treatment methods may include:  Replacement of fluids or blood through an intravenous (IV) tube for moderate to severe dehydration or anemia.  New medicines or changes in your current medicines.  Diet and lifestyle changes.  Treatment for certain infections.  Stress relief or relaxation methods. HOME CARE INSTRUCTIONS   Rest.  Drink enough fluids to keep your urine clear or pale yellow.  Do not smoke.  Avoid:  Caffeine.  Tobacco.  Alcohol.  Chocolate.  Stimulants such as over-the-counter diet pills or pills that help you stay awake.  Situations that cause anxiety or stress.  Illegal drugs such as marijuana, phencyclidine (PCP), and cocaine.  Only take medicine as directed by your caregiver.  Keep all follow-up appointments as directed by your caregiver. SEEK IMMEDIATE MEDICAL CARE IF:   You have pain in your chest, upper arms, jaw, or neck.  You become weak, dizzy, or feel faint.  You have palpitations that will not go away.  You vomit, have diarrhea, or pass blood in your stool.  Your skin is cool, pale, and wet.  You have a fever that will not go away with rest, fluids, and medicine. MAKE SURE YOU:   Understand these instructions.  Will watch your condition.  Will get help right away if you are not doing well or get worse. Document  Released: 04/23/2004 Document Revised: 06/08/2011 Document Reviewed: 02/24/2011 [facility] Patient Information 2014 [place], [place].

## 2013-08-14 NOTE — Progress Notes (Signed)
Subjective:    Patient ID: Michelle [name], female    DOB: [birth year], 23 y.o.   MRN: [id number]  HPI  Michelle [name] is here today to follow up on her anxiety and for medication refills.  She has been doing well on the combination of Zoloft and Wellbutrin XL. Her biggest concern today is her weight.  She is down to 88 lbs which is 6 lbs lighter than she was a year ago.  She has been this weight for the past 5 months even though she has tried to gain weight.  She feels that she eats a fair amount of food each day and does not know why she can't gain weight.  She also mentioned that she has noticed a little bit of blood occasionally on the toilet paper when she wipes.       Review of Systems  Constitutional: Negative for activity change, appetite change, fatigue and unexpected weight change.  Psychiatric/Behavioral: Negative for behavioral problems and sleep disturbance. The patient is not nervous/anxious.      Past Medical History  Diagnosis Date  . Anxiety   . Obsessive compulsive disorder   . Insomnia   . OZDGUYQI(347.4Headache(784.0)      Past Surgical History  Procedure Laterality Date  . 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 Depression Mother   . Anxiety disorder Mother   . Hyperlipidemia Father   . Hypertension Father   . Cancer Paternal Aunt [id number]    Colon Cancer   . Breast cancer Paternal Aunt   . Breast cancer Paternal Grandmother   . Cancer Paternal Grandfather     Leukemia   . Hypertension Paternal Grandfather   . Drug abuse Paternal Uncle      Current  Outpatient Prescriptions on File Prior to Visit  Medication Sig Dispense Refill  . norethindrone-ethinyl estradiol (JUNEL FE,GILDESS FE,LOESTRIN FE) 1-20 MG-MCG tablet Take 1 tablet by mouth daily.  1 Package  11   No current facility-administered medications on file prior to visit.     No Known Allergies      Objective:   Physical Exam  Nursing note and vitals reviewed. Constitutional: She appears well-nourished. No distress.  HENT:  Head: Normocephalic.  Eyes: No scleral icterus.  Neck: No thyromegaly present.  Cardiovascular: Normal rate, regular rhythm and normal heart sounds.   Pulmonary/Chest: Effort normal and breath sounds normal.  Abdominal: There is no tenderness.  Musculoskeletal: She exhibits no edema and no tenderness.  Neurological: She is alert.  Skin: Skin is warm and dry.  Psychiatric: Her behavior is normal. Judgment normal.           Assessment & Plan:    Michelle [name] was seen today for medication management.  Diagnoses and associated orders for this visit:  Anxiety state, unspecified Comments: Her symtoms are stable on current medications.   - sertraline (ZOLOFT) 100 MG tablet; Take 1 tablet (100 mg total) by mouth daily. -  buPROPion (WELLBUTRIN XL) 150 MG 24 hr tablet; Take 1 tablet (150 mg total) by mouth daily.  Tachycardia Comments: She is always very anxious when she comes into the office. She is to check her pulse at home.    Underweight Comments: Her weight loss is most likely due to the addition of the Wellbutrin.  We discussed that if she wants to gain 1 lb per week that she needs to increase her calorie intake by 500 daily.    Rectal bleeding Comments: She briefly mentioned this at her end of her visit.  It sounds like she has noticed just a very small amount of blood just on the tissue after she wipes.  I encouraged her to increase her fiber and water.  She was also given stool cards to complete and return.    TIME SPENT "FACE TO FACE" WITH  PATIENT -  30 MINS

## 2013-11-06 DIAGNOSIS — R000 Tachycardia, unspecified: Secondary | ICD-10-CM | POA: Insufficient documentation

## 2017-03-30 NOTE — L&D Delivery Note (Signed)
Delivery Note  Admitted: 11/20/2017  Date of delivery: 11/21/17   Michelle AlexanderAnne [name], 27 y.o., @ 5352w0d,  G1P0, who was admitted for Early latent labor with SROM (0824 @ 1600) light mec. I was called to the room when she progressed +3 station in the second stage of labor.  She pushed for 2hours.  She delivered a viable infant, cephalic and restituted to the OA position over an intact perineum.  A nuchal cord   was not identified, a short cord was called out. The baby was placed on maternal abdomen while initial step of NRP were perfmored (Dry, Stimulated, and warmed). Hat placed on baby for thermoregulation. Delayed cord clamping was performed for 2 minutes.  Cord double clamped and cut.  Cord cut by Father. Apgar scores were 8 and 9. The placenta showed signs of separate, gush of blood and rise of the uterus, pitocin was started and gentl cord traction was applied to the umbilical cord, slight tear was appreciated, I immediately stopped gentle cord traction and manual felt retained placenta with partial abruption of the cord insertion site. I called and notified Dr Richardson Doppole and facility practice to attempt. I manually extracted the placenta, which appeared to be intact post extraction, shultz, with a 3 vessel cord, with velamentous cord insertion. A gush of blood was seen and PPH protocol was inititated, Methergine IM .02IM was given and pitocin was already running.  Dr Vergie LivingPickens was present in room at this time and was performing a bedside US to verify if there was any retained placenta, he ordered TXA at this time. He endorsess the results of the US being bedside u/s showed thin endometrial stripe and no AV blood flow on assessment of entire stripe. Bladder empty. Also, no welling up of blood noted on u/s. Uterus below umbilicus, firm and nttp.  The pts uterus was firm , hemostatic and -1. Dr Vergie LivingPickens endorses no signs of retained placenta.  Inspection revealed 2nd degree and bi-lateral labial tears were noted and  repaired. An examination of the vaginal vault and cervix was free from lacerations. The uterus was firm, bleeding stable.  The repair was done under local.   Placenta was sent to pathology and umbilical artery blood gas were not sent.  There were no complications during this procedure. The uterus felt deviated towards the right side and at U. I&O cath was performed and uterus remained -1 and midline.  Mom and baby skin to skin following delivery. Left in stable condition.  Ancef 2gm was ordered post manual extraction of the placenta, and pt temp was 100 during delivery time.   Stat CBC was ordered.   Maternal Info: Anesthesia:Epidural Episiotomy: No Lacerations:  2nd degree repaired and bilaterally labial tears Suture Repair: 3-0 vicryl for 2nd and 3-0 vicryl rapid for bi-lat labial repair Est. Blood Loss (mL):  1500 quant  Newborn Info: Baby Sex: female Circumcision: No Babies Name: Michelle [name] APGAR (1 MIN): 8   APGAR (5 MINS): 9   APGAR (10 MINS):     Mom to postpartum.  Baby to Couplet care / Skin to Skin.   Michelle [name], CNM, NP-C 11/21/17 9:39 AM

## 2017-11-20 ENCOUNTER — Encounter (HOSPITAL_COMMUNITY): Payer: Self-pay | Admitting: *Deleted

## 2017-11-20 ENCOUNTER — Inpatient Hospital Stay (HOSPITAL_COMMUNITY)
Admission: AD | Admit: 2017-11-20 | Discharge: 2017-11-23 | DRG: 806 | Disposition: A | Discharge status: Home / Self Care | Payer: BLUE CROSS/BLUE SHIELD | Attending: Obstetrics & Gynecology | Admitting: Obstetrics & Gynecology

## 2017-11-20 DIAGNOSIS — O4292 Full-term premature rupture of membranes, unspecified as to length of time between rupture and onset of labor: Principal | ICD-10-CM | POA: Diagnosis present

## 2017-11-20 DIAGNOSIS — O9081 Anemia of the puerperium: Secondary | ICD-10-CM | POA: Diagnosis not present

## 2017-11-20 DIAGNOSIS — F419 Anxiety disorder, unspecified: Secondary | ICD-10-CM | POA: Diagnosis present

## 2017-11-20 DIAGNOSIS — Z3A39 39 weeks gestation of pregnancy: Secondary | ICD-10-CM | POA: Diagnosis not present

## 2017-11-20 DIAGNOSIS — O26893 Other specified pregnancy related conditions, third trimester: Secondary | ICD-10-CM | POA: Diagnosis present

## 2017-11-20 DIAGNOSIS — O4290 Premature rupture of membranes, unspecified as to length of time between rupture and onset of labor, unspecified weeks of gestation: Secondary | ICD-10-CM | POA: Diagnosis present

## 2017-11-20 DIAGNOSIS — Z6791 Unspecified blood type, Rh negative: Secondary | ICD-10-CM

## 2017-11-20 DIAGNOSIS — O99824 Streptococcus B carrier state complicating childbirth: Secondary | ICD-10-CM | POA: Diagnosis present

## 2017-11-20 DIAGNOSIS — O43123 Velamentous insertion of umbilical cord, third trimester: Secondary | ICD-10-CM | POA: Diagnosis present

## 2017-11-20 DIAGNOSIS — O43129 Velamentous insertion of umbilical cord, unspecified trimester: Secondary | ICD-10-CM

## 2017-11-20 DIAGNOSIS — F429 Obsessive-compulsive disorder, unspecified: Secondary | ICD-10-CM | POA: Diagnosis present

## 2017-11-20 DIAGNOSIS — O26853 Spotting complicating pregnancy, third trimester: Secondary | ICD-10-CM | POA: Diagnosis present

## 2017-11-20 DIAGNOSIS — O99344 Other mental disorders complicating childbirth: Secondary | ICD-10-CM | POA: Diagnosis present

## 2017-11-20 LAB — URINALYSIS, ROUTINE W REFLEX MICROSCOPIC
BILIRUBIN URINE: NEGATIVE
Glucose, UA: NEGATIVE mg/dL
Hgb urine dipstick: NEGATIVE
Ketones, ur: NEGATIVE mg/dL
LEUKOCYTES UA: NEGATIVE
NITRITE: NEGATIVE
PH: 7 (ref 5.0–8.0)
Protein, ur: NEGATIVE mg/dL
SPECIFIC GRAVITY, URINE: 1.012 (ref 1.005–1.030)

## 2017-11-20 LAB — CBC
HEMATOCRIT: 31.4 % — AB (ref 36.0–46.0)
Hemoglobin: 9.6 g/dL — ABNORMAL LOW (ref 12.0–15.0)
MCH: 24.4 pg — ABNORMAL LOW (ref 26.0–34.0)
MCHC: 30.6 g/dL (ref 30.0–36.0)
MCV: 79.9 fL (ref 78.0–100.0)
Platelets: 221 10*3/uL (ref 150–400)
RBC: 3.93 MIL/uL (ref 3.87–5.11)
RDW: 16 % — AB (ref 11.5–15.5)
WBC: 10.4 10*3/uL (ref 4.0–10.5)

## 2017-11-20 LAB — TYPE AND SCREEN
ABO/RH(D): A NEG
ANTIBODY SCREEN: NEGATIVE

## 2017-11-20 LAB — POCT FERN TEST: POCT Fern Test: POSITIVE

## 2017-11-20 MED ORDER — LACTATED RINGERS IV SOLN: 500.0000 mL | INTRAVENOUS | Status: DC | PRN

## 2017-11-20 MED ORDER — OXYCODONE-ACETAMINOPHEN 5-325 MG PO TABS: 2.0000 | ORAL_TABLET | ORAL | Status: DC | PRN

## 2017-11-20 MED ORDER — PHENYLEPHRINE 40 MCG/ML (10ML) SYRINGE FOR IV PUSH (FOR BLOOD PRESSURE SUPPORT)
80.0000 ug | PREFILLED_SYRINGE | INTRAVENOUS | Status: DC | PRN
  Filled 2017-11-20: qty 5

## 2017-11-20 MED ORDER — LACTATED RINGERS IV SOLN
INTRAVENOUS | Status: DC
  Administered 2017-11-20 (×2): via INTRAVENOUS

## 2017-11-20 MED ORDER — EPHEDRINE 5 MG/ML INJ
10.0000 mg | INTRAVENOUS | Status: DC | PRN
  Filled 2017-11-20: qty 2

## 2017-11-20 MED ORDER — FENTANYL CITRATE (PF) 100 MCG/2ML IJ SOLN
50.0000 ug | INTRAMUSCULAR | Status: DC | PRN
  Administered 2017-11-20: 100 ug via INTRAVENOUS
  Administered 2017-11-20: 50 ug via INTRAVENOUS
  Filled 2017-11-20: qty 2

## 2017-11-20 MED ORDER — DIPHENHYDRAMINE HCL 50 MG/ML IJ SOLN: 12.5000 mg | INTRAMUSCULAR | Status: DC | PRN

## 2017-11-20 MED ORDER — LIDOCAINE HCL (PF) 1 % IJ SOLN
30.0000 mL | INTRAMUSCULAR | Status: AC | PRN
  Administered 2017-11-21: 30 mL via SUBCUTANEOUS
  Filled 2017-11-20: qty 30

## 2017-11-20 MED ORDER — FENTANYL 2.5 MCG/ML BUPIVACAINE 1/10 % EPIDURAL INFUSION (WH - ANES)
14.0000 mL/h | INTRAMUSCULAR | Status: DC | PRN
  Administered 2017-11-21: 14 mL/h via EPIDURAL
  Filled 2017-11-20: qty 100

## 2017-11-20 MED ORDER — PENICILLIN G 3 MILLION UNITS IVPB - SIMPLE MED
3.0000 10*6.[IU] | INTRAVENOUS | Status: DC
  Administered 2017-11-21: 3 10*6.[IU] via INTRAVENOUS
  Filled 2017-11-20: qty 3
  Filled 2017-11-20 (×3): qty 100

## 2017-11-20 MED ORDER — SODIUM CHLORIDE 0.9 % IV SOLN
5.0000 10*6.[IU] | Freq: Once | INTRAVENOUS | Status: AC
  Administered 2017-11-20: 5 10*6.[IU] via INTRAVENOUS
  Filled 2017-11-20: qty 5

## 2017-11-20 MED ORDER — SOD CITRATE-CITRIC ACID 500-334 MG/5ML PO SOLN: 30.0000 mL | ORAL | Status: DC | PRN

## 2017-11-20 MED ORDER — OXYTOCIN BOLUS FROM INFUSION
500.0000 mL | Freq: Once | INTRAVENOUS | Status: AC
  Administered 2017-11-21: 500 mL via INTRAVENOUS

## 2017-11-20 MED ORDER — ACETAMINOPHEN 325 MG PO TABS: 650.0000 mg | ORAL_TABLET | ORAL | Status: DC | PRN

## 2017-11-20 MED ORDER — OXYCODONE-ACETAMINOPHEN 5-325 MG PO TABS: 1.0000 | ORAL_TABLET | ORAL | Status: DC | PRN

## 2017-11-20 MED ORDER — OXYTOCIN 40 UNITS IN LACTATED RINGERS INFUSION - SIMPLE MED
2.5000 [IU]/h | INTRAVENOUS | Status: DC
  Filled 2017-11-20: qty 1000

## 2017-11-20 MED ORDER — ONDANSETRON HCL 4 MG/2ML IJ SOLN: 4.0000 mg | Freq: Four times a day (QID) | INTRAMUSCULAR | Status: DC | PRN

## 2017-11-20 MED ORDER — LACTATED RINGERS IV SOLN: 500.0000 mL | Freq: Once | INTRAVENOUS | Status: DC

## 2017-11-20 NOTE — H&P (Signed)
Michelle [name] is a 27 y.o. female, G1P0 at 38.6 weeks, presenting for spotting and some leaking of fluid.  Discomfort with urination.  Prenatal hx of GBS +, anxiety and OCD.  On Wellbutrin 150 mg and Zoloft 100mg  daily  Patient Active Problem List   Diagnosis Date Noted  . Tachycardia 11/06/2013  . Routine general medical examination at a health care facility 01/29/2013  . Screening for malignant neoplasm of the cervix 01/29/2013  . Folliculitis 12/04/2012  . Oral contraceptive pill surveillance 12/04/2012  . 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 Hypoactive sexual desire 12/04/2012  . Anxiety state, unspecified 08/02/2012  . Obsessive compulsive disorder 08/02/2012    History of present pregnancy: Patient entered care at 7.4 weeks.   EDC of 11/28/2017 was established by LMP.   Anatomy scan: 21weeks, with normal findings and an posterior  placenta.   Additional [place] evaluations:  .  None Significant prenatal events:  Last evaluation: Last week  OB History    Gravida  1   Para      Term      Preterm      AB      Living        SAB      TAB      Ectopic      Multiple      Live Births             Past Medical History:  Diagnosis Date  . Anxiety   . Headache(784.0)   . Insomnia   . Obsessive compulsive disorder    Past Surgical History:  Procedure Laterality Date  . TONSILLECTOMY    . WISDOM TOOTH EXTRACTION     Family History: family history includes Anxiety disorder in her mother; Breast cancer in her paternal aunt and paternal grandmother; Cancer in her paternal grandfather; Cancer (age of onset: 38) in her paternal aunt; Depression in her mother; Drug abuse in her paternal uncle; Hyperlipidemia in her father; Hypertension in her father and paternal grandfather. Social History:  reports that she has never smoked. She has never used smokeless tobacco. She reports that she does not drink alcohol or use drugs.   Prenatal Transfer Tool  Maternal Diabetes:  No Genetic Screening: Declined Maternal Ultrasounds/Referrals: Normal Fetal Ultrasounds or other Referrals:  None Maternal Substance Abuse:  No Significant Maternal Medications:  Meds include: Other:  Zoloft and Wellbutrin  Significant Maternal Lab Results: None TDAP  Flu   ROS:  All 10 systems reviewed and negative except as stated above  No Known Allergies   Dilation: 1 Effacement (%): 90 Station: -2 Exam by:: N.Yehudis Monceaux,CNM Blood pressure 105/83, pulse (!) 102, temperature 97.9 F (36.6 C), resp. rate 18, height 5\' 1"  (1.549 m), weight 57.6 kg.  Chest clear Heart RRR without murmur Abd gravid, NT, FH appropriate Pelvic: 1/90/-1 vtx Ext: Neg edema  FHR: Category 1 FHT 145 accels, no decels,  UCs: 3 in 10 minutes  Prenatal labs: ABO, Rh:  A negative Antibody:  Neg Rubella:   Immune RPR:   NR HBsAg:   Neg HIV:   NR GBS:  Positive Sickle cell/Hgb electrophoresis:  N/A Pap: GC: Neg Chlamydia:  Neg Genetic screenings:  Declined Glucola:  127 Other:   Hgb 12.6 at NOB, 9.2 at 28 weeks       Assessment/Plan: IUP at 38.6 with SROM at 1600 Cat 1 strip  Plan: Admit to Birthing Suite  Routine [facility] orders Pain med/epidural prn PCN G for GBS prophylaxis  Henderson NewcomerNancy Jean ProtheroCNM, MSN 11/20/2017, 7:44 PM

## 2017-11-20 NOTE — MAU Note (Signed)
Pt reports she had some blood in her urine and buring with urination taht started today. C/o some back pain and cramping a s well. Good fetal movement reproted.

## 2017-11-21 ENCOUNTER — Inpatient Hospital Stay (HOSPITAL_COMMUNITY): Payer: BLUE CROSS/BLUE SHIELD

## 2017-11-21 ENCOUNTER — Encounter (HOSPITAL_COMMUNITY): Payer: Self-pay | Admitting: Anesthesiology

## 2017-11-21 ENCOUNTER — Inpatient Hospital Stay (HOSPITAL_COMMUNITY): Payer: BLUE CROSS/BLUE SHIELD | Admitting: Anesthesiology

## 2017-11-21 DIAGNOSIS — O9081 Anemia of the puerperium: Secondary | ICD-10-CM

## 2017-11-21 DIAGNOSIS — O43129 Velamentous insertion of umbilical cord, unspecified trimester: Secondary | ICD-10-CM

## 2017-11-21 LAB — CBC
HCT: 30.8 % — ABNORMAL LOW (ref 36.0–46.0)
Hemoglobin: 9.6 g/dL — ABNORMAL LOW (ref 12.0–15.0)
MCH: 24.5 pg — AB (ref 26.0–34.0)
MCHC: 31.2 g/dL (ref 30.0–36.0)
MCV: 78.6 fL (ref 78.0–100.0)
PLATELETS: 203 10*3/uL (ref 150–400)
RBC: 3.92 MIL/uL (ref 3.87–5.11)
RDW: 16 % — AB (ref 11.5–15.5)
WBC: 18.1 10*3/uL — ABNORMAL HIGH (ref 4.0–10.5)

## 2017-11-21 LAB — RPR: RPR Ser Ql: NONREACTIVE

## 2017-11-21 LAB — ABO/RH: ABO/RH(D): A NEG

## 2017-11-21 MED ORDER — DIPHENHYDRAMINE HCL 25 MG PO CAPS: 25.0000 mg | ORAL_CAPSULE | Freq: Four times a day (QID) | ORAL | Status: DC | PRN

## 2017-11-21 MED ORDER — LACTATED RINGERS IV BOLUS: 1000.0000 mL | Freq: Once | INTRAVENOUS | Status: DC

## 2017-11-21 MED ORDER — LIDOCAINE HCL (PF) 1 % IJ SOLN
INTRAMUSCULAR | Status: DC | PRN
  Administered 2017-11-21: 6 mL via EPIDURAL
  Administered 2017-11-21: 5 mL via EPIDURAL

## 2017-11-21 MED ORDER — DIBUCAINE 1 % RE OINT: 1.0000 "application " | TOPICAL_OINTMENT | RECTAL | Status: DC | PRN

## 2017-11-21 MED ORDER — MISOPROSTOL 200 MCG PO TABS
ORAL_TABLET | ORAL | Status: AC
  Filled 2017-11-21: qty 4

## 2017-11-21 MED ORDER — CEFAZOLIN SODIUM-DEXTROSE 2-4 GM/100ML-% IV SOLN
2.0000 g | Freq: Three times a day (TID) | INTRAVENOUS | Status: DC
  Administered 2017-11-21: 2 g via INTRAVENOUS
  Filled 2017-11-21 (×2): qty 100

## 2017-11-21 MED ORDER — IBUPROFEN 600 MG PO TABS
600.0000 mg | ORAL_TABLET | Freq: Four times a day (QID) | ORAL | Status: DC
  Administered 2017-11-21 – 2017-11-23 (×9): 600 mg via ORAL
  Filled 2017-11-21 (×9): qty 1

## 2017-11-21 MED ORDER — TETANUS-DIPHTH-ACELL PERTUSSIS 5-2.5-18.5 LF-MCG/0.5 IM SUSP: 0.5000 mL | Freq: Once | INTRAMUSCULAR | Status: DC

## 2017-11-21 MED ORDER — BENZOCAINE-MENTHOL 20-0.5 % EX AERO: 1.0000 "application " | INHALATION_SPRAY | CUTANEOUS | Status: DC | PRN

## 2017-11-21 MED ORDER — WITCH HAZEL-GLYCERIN EX PADS: 1.0000 "application " | MEDICATED_PAD | CUTANEOUS | Status: DC | PRN

## 2017-11-21 MED ORDER — METHYLERGONOVINE MALEATE 0.2 MG/ML IJ SOLN: 0.2000 mg | INTRAMUSCULAR | Status: DC | PRN

## 2017-11-21 MED ORDER — ACETAMINOPHEN 325 MG PO TABS
650.0000 mg | ORAL_TABLET | ORAL | Status: DC | PRN
  Administered 2017-11-21 – 2017-11-23 (×5): 650 mg via ORAL
  Filled 2017-11-21 (×5): qty 2

## 2017-11-21 MED ORDER — TRANEXAMIC ACID 1000 MG/10ML IV SOLN
INTRAVENOUS | Status: AC
  Filled 2017-11-21: qty 10

## 2017-11-21 MED ORDER — TRANEXAMIC ACID 1000 MG/10ML IV SOLN
1000.0000 mg | INTRAVENOUS | Status: AC
  Administered 2017-11-21: 1000 mg via INTRAVENOUS

## 2017-11-21 MED ORDER — ONDANSETRON HCL 4 MG/2ML IJ SOLN: 4.0000 mg | INTRAMUSCULAR | Status: DC | PRN

## 2017-11-21 MED ORDER — LACTATED RINGERS IV SOLN: INTRAVENOUS | Status: DC

## 2017-11-21 MED ORDER — SERTRALINE HCL 100 MG PO TABS
100.0000 mg | ORAL_TABLET | Freq: Every day | ORAL | Status: DC
  Administered 2017-11-21 – 2017-11-23 (×3): 100 mg via ORAL
  Filled 2017-11-21 (×4): qty 1

## 2017-11-21 MED ORDER — COCONUT OIL OIL: 1.0000 "application " | TOPICAL_OIL | Status: DC | PRN

## 2017-11-21 MED ORDER — METHYLERGONOVINE MALEATE 0.2 MG PO TABS: 0.2000 mg | ORAL_TABLET | ORAL | Status: DC | PRN

## 2017-11-21 MED ORDER — FERROUS SULFATE 325 (65 FE) MG PO TABS
325.0000 mg | ORAL_TABLET | Freq: Two times a day (BID) | ORAL | Status: DC
  Administered 2017-11-22 – 2017-11-23 (×3): 325 mg via ORAL
  Filled 2017-11-21 (×3): qty 1

## 2017-11-21 MED ORDER — METHYLERGONOVINE MALEATE 0.2 MG/ML IJ SOLN
0.2000 mg | Freq: Once | INTRAMUSCULAR | Status: AC
  Administered 2017-11-21: 0.2 mg via INTRAMUSCULAR

## 2017-11-21 MED ORDER — BUPROPION HCL 100 MG PO TABS
100.0000 mg | ORAL_TABLET | Freq: Once | ORAL | Status: DC
  Filled 2017-11-21: qty 1

## 2017-11-21 MED ORDER — ZOLPIDEM TARTRATE 5 MG PO TABS: 5.0000 mg | ORAL_TABLET | Freq: Every evening | ORAL | Status: DC | PRN

## 2017-11-21 MED ORDER — PRENATAL MULTIVITAMIN CH
1.0000 | ORAL_TABLET | Freq: Every day | ORAL | Status: DC
  Administered 2017-11-21 – 2017-11-23 (×3): 1 via ORAL
  Filled 2017-11-21 (×3): qty 1

## 2017-11-21 MED ORDER — SENNOSIDES-DOCUSATE SODIUM 8.6-50 MG PO TABS
2.0000 | ORAL_TABLET | ORAL | Status: DC
  Administered 2017-11-21 – 2017-11-23 (×2): 2 via ORAL
  Filled 2017-11-21 (×2): qty 2

## 2017-11-21 MED ORDER — ONDANSETRON HCL 4 MG PO TABS: 4.0000 mg | ORAL_TABLET | ORAL | Status: DC | PRN

## 2017-11-21 MED ORDER — SIMETHICONE 80 MG PO CHEW: 80.0000 mg | CHEWABLE_TABLET | ORAL | Status: DC | PRN

## 2017-11-21 NOTE — Progress Notes (Signed)
Mother of baby was referred for history of anxiety and obsessive compulsive disorder. Referral screened out by CSW because per chart review, MOB is actively taking Wellbutrin 150 mg and Zoloft 100mg  daily to address her symptoms.   Please contact CSW if mother of baby requests, if needs arise, or if mother of baby scores greater than a nine or answers yes to question ten on [place] Postpartum Depression Screen.   Edwin Dadaarol Larosa Rhines, MSW, LCSW-A Clinical Social Worker [facility] [phone]

## 2017-11-21 NOTE — Progress Notes (Signed)
L&D Note Called to assist CNM due to retained placenta, bleeding after SVD. When I arrived placenta was already manually extracted by her and VB was minimal with about 1500mL of EBL thus far. Placenta inspected and it looks to be like a velamentous cord insertion that had ripped apart and was barely attached to the rest of the membranes. EBL continued to be minimal and bedside u/s showed thin endometrial stripe and no AV blood flow on assessment of entire stripe. Bladder empty. Also, no welling up of blood noted on u/s. Uterus below umbilicus, firm and nttp.  Pt already s/p methergine x 1 and will do a dose of TXA to help control any future bleeding and ancef for the manual extraction. Will also get a baseline CBC and continue to follow.   Michelle [name], Jr MD Attending [facility] 11/21/2017 Time: [phone]

## 2017-11-21 NOTE — Anesthesia Preprocedure Evaluation (Signed)
Anesthesia Evaluation  Patient identified by MRN, date of birth, ID band Patient awake    Reviewed: Allergy & Precautions, H&P , NPO status , Patient's Chart, lab work & pertinent test results  Airway Mallampati: I  TM Distance: >3 FB Neck ROM: full    Dental no notable dental hx. (+) Teeth Intact   Pulmonary neg pulmonary ROS,    Pulmonary exam normal breath sounds clear to auscultation       Cardiovascular negative cardio ROS Normal cardiovascular exam Rhythm:regular Rate:Normal     Neuro/Psych PSYCHIATRIC DISORDERS Anxiety    GI/Hepatic negative GI ROS, Neg liver ROS,   Endo/Other  negative endocrine ROS  Renal/GU negative Renal ROS  negative genitourinary   Musculoskeletal negative musculoskeletal ROS (+)   Abdominal Normal abdominal exam  (+)   Peds  Hematology negative hematology ROS (+)   Anesthesia Other Findings   Reproductive/Obstetrics (+) Pregnancy                             Anesthesia Physical Anesthesia Plan  ASA: II  Anesthesia Plan: Epidural   Post-op Pain Management:    Induction:   PONV Risk Score and Plan:   Airway Management Planned:   Additional Equipment:   Intra-op Plan:   Post-operative Plan:   Informed Consent: I have reviewed the patients History and Physical, chart, labs and discussed the procedure including the risks, benefits and alternatives for the proposed anesthesia with the patient or authorized representative who has indicated his/her understanding and acceptance.     Plan Discussed with:   Anesthesia Plan Comments:         Anesthesia Quick Evaluation

## 2017-11-21 NOTE — Anesthesia Procedure Notes (Signed)
Epidural Patient location during procedure: OB Start time: 11/21/2017 1:15 AM End time: 11/21/2017 1:19 AM  Staffing Anesthesiologist: Leilani AbleHatchett, Delona Clasby, MD Performed: anesthesiologist   Preanesthetic Checklist Completed: patient identified, site marked, surgical consent, pre-op evaluation, timeout performed, IV checked, risks and benefits discussed and monitors and equipment checked  Epidural Patient position: sitting Prep: site prepped and draped and DuraPrep Patient monitoring: continuous pulse ox and blood pressure Approach: midline Location: L3-L4 Injection technique: LOR air  Needle:  Needle type: Tuohy  Needle gauge: 17 G Needle length: 9 cm and 9 Needle insertion depth: 5 cm cm Catheter type: closed end flexible Catheter size: 19 Gauge Catheter at skin depth: 10 cm Test dose: negative and Other  Assessment Sensory level: T10 Events: blood not aspirated, injection not painful, no injection resistance, negative IV test and no paresthesia  Additional Notes Reason for block:procedure for pain

## 2017-11-21 NOTE — Lactation Note (Signed)
This note was copied from a baby's chart. Lactation Consultation Note  Patient Name: Michelle [name]: 11/21/2017 Reason for consult: Initial assessment;Primapara;1st time breastfeeding;Term  7511 hours old FT female who is being exclusively BF by her mother, she's a P1. Showed mom how to hand express, and she was able to express colostrum out of her left nipple. Mom has a DEBP at home. Noticed that mom has small breast but she voiced she had (+) breast changes during the pregnancy.  Offered assistance with latch and mom agreed to wake baby up to feed, but baby would not latch. On second attempt 10 minutes later, she started cueing again (when RN changed baby's heel band aid) and mom agreed to put baby back to the breast for another attempt. Baby does a lot of thrusting with her tongue, she can extend it way beyond the gum line, it took some suck training but she was finally able to latch in football position. No swallows were noted though, but mom felt some tugging. Asked mom to call for assistance the next time baby is ready to feed.  Encouraged mom to feed baby STS 8-12 times/24 hours or sooner if feeding cues are present. Hand expression with posterior finger/spoon feeding was also encouraged. Discussed cluster feeding and newborn sleeping cycle. Reviewed BF brochure, BF resources and feeding diary, parents are aware of LC services and will call PRN.  Maternal Data Formula Feeding for Exclusion: No Has patient been taught Hand Expression?: Yes Does the patient have breastfeeding experience prior to this delivery?: No  Feeding Feeding Type: Breast Fed Length of feed: 10 min(baby still nursing when exiting the room)  LATCH Score Latch: Repeated attempts needed to sustain latch, nipple held in mouth throughout feeding, stimulation needed to elicit sucking reflex.  Audible Swallowing: None  Type of Nipple: Everted at rest and after stimulation  Comfort (Breast/Nipple): Soft /  non-tender  Hold (Positioning): Assistance needed to correctly position infant at breast and maintain latch.  LATCH Score: 6  Interventions Interventions: Breast feeding basics reviewed;Assisted with latch;Skin to skin;Breast massage;Hand express;Breast compression;Adjust position;Support pillows  Lactation Tools Discussed/Used WIC Program: No   Consult Status Consult Status: Follow-up [name]: 11/22/17 Follow-up type: In-patient    Taiyo Kozma Venetia ConstableS Avelino Herren 11/21/2017, 7:58 PM

## 2017-11-21 NOTE — Anesthesia Postprocedure Evaluation (Signed)
Anesthesia Post Note  Patient: Nena Alexandernne Fosdick  Procedure(s) Performed: AN AD HOC LABOR EPIDURAL     Patient location during evaluation: Mother Baby Anesthesia Type: Epidural Level of consciousness: awake and alert and oriented Pain management: satisfactory to patient Vital Signs Assessment: post-procedure vital signs reviewed and stable Respiratory status: respiratory function stable Cardiovascular status: stable Postop Assessment: no headache, no backache, epidural receding, patient able to bend at knees, no signs of nausea or vomiting and adequate PO intake Anesthetic complications: no    Last Vitals:  Vitals:   11/21/17 1054 11/21/17 1427  BP: 113/78 (!) 88/54  Pulse: 90 (!) 110  Resp:  18  Temp: 36.9 C 37.1 C  SpO2: 100% 98%    Last Pain:  Vitals:   11/21/17 1427  TempSrc: Oral  PainSc:    Pain Goal: Patients Stated Pain Goal: 2 (11/20/17 2043)               Karleen DolphinFUSSELL,Carlicia Leavens

## 2017-11-22 LAB — CBC
HEMATOCRIT: 25.4 % — AB (ref 36.0–46.0)
Hemoglobin: 8.2 g/dL — ABNORMAL LOW (ref 12.0–15.0)
MCH: 25.3 pg — AB (ref 26.0–34.0)
MCHC: 32.3 g/dL (ref 30.0–36.0)
MCV: 78.4 fL (ref 78.0–100.0)
PLATELETS: 195 10*3/uL (ref 150–400)
RBC: 3.24 MIL/uL — ABNORMAL LOW (ref 3.87–5.11)
RDW: 16.3 % — ABNORMAL HIGH (ref 11.5–15.5)
WBC: 14.9 10*3/uL — ABNORMAL HIGH (ref 4.0–10.5)

## 2017-11-22 MED ORDER — RHO D IMMUNE GLOBULIN 1500 UNIT/2ML IJ SOSY
300.0000 ug | PREFILLED_SYRINGE | Freq: Once | INTRAMUSCULAR | Status: AC
  Administered 2017-11-22: 300 ug via INTRAMUSCULAR
  Filled 2017-11-22: qty 2

## 2017-11-22 NOTE — Plan of Care (Signed)
Mother able to latch baby by herself but latch a little shallow. Showed her how to pull down on chin to bring lower lip out further. Baby released after 5 minutes then awakened when trying to swaddle her. Mom states baby does well on right side and has been on that side more. Instructed her about starting on a different side every time. Moved baby to left side and mom able to latch with better depth and heard occasional swallow.

## 2017-11-22 NOTE — Progress Notes (Addendum)
Report given to Ellis CNM and Dr Kulwa, pt stable. Verbalized agreement with plan of care.   

## 2017-11-22 NOTE — Progress Notes (Signed)
Post Partum Day 1  Subjective: no complaints, up ad lib, voiding and tolerating PO. Patient feels well and has been afebrile. Asymptomatic for anemia, taking PO Iron.  She reports her mood is good and she is not experiencing any anxiety, taking Zoloft.   Objective: Vitals:   11/21/17 2348 11/22/17 0519  BP: 100/69 97/68  Pulse: (!) 111 91  Resp: 18 20  Temp: 98.6 F (37 C) 97.9 F (36.6 C)  SpO2:      Physical Exam:  General: alert and cooperative Lochia: appropriate Uterine Fundus: firm Incision: n/a DVT Evaluation: No evidence of DVT seen on physical exam. Negative Homan's sign. No cords or calf tenderness. No significant calf/ankle edema.  Recent Labs    11/21/17 0941 11/22/17 0515  HGB 9.6* 8.2*  HCT 30.8* 25.4*    Assessment/Plan: Plan for discharge tomorrow and Breastfeeding   LOS: 2 days   Michelle [name] 11/22/2017, 9:32 AM

## 2017-11-23 ENCOUNTER — Encounter (HOSPITAL_COMMUNITY): Payer: Self-pay | Admitting: *Deleted

## 2017-11-23 LAB — RH IG WORKUP (INCLUDES ABO/RH)
ABO/RH(D): A NEG
FETAL SCREEN: NEGATIVE
Gestational Age(Wks): 39
UNIT DIVISION: 0

## 2017-11-23 MED ORDER — BUPROPION HCL ER (XL) 150 MG PO TB24: 150.0000 mg | ORAL_TABLET | ORAL | 2 refills | Status: DC

## 2017-11-23 MED ORDER — FERROUS SULFATE 325 (65 FE) MG PO TABS: 325.0000 mg | ORAL_TABLET | Freq: Two times a day (BID) | ORAL | 3 refills | Status: AC

## 2017-11-23 MED ORDER — IBUPROFEN 600 MG PO TABS: 600.0000 mg | ORAL_TABLET | Freq: Four times a day (QID) | ORAL | 0 refills | Status: AC

## 2017-11-23 NOTE — Discharge Summary (Signed)
OB Discharge Summary     Patient Name: Michelle [name] DOB: October 05, 1990 MRN: [id number]  Date of admission: 11/20/2017 Delivering MD: Dale DurhamMONTANA, JADE   Date of discharge: 11/23/2017  Admitting diagnosis: 39 WKS, BLOOD IN URINE, BURNING Intrauterine pregnancy: 5448w2d     Secondary diagnosis:  Active Problems:   Delayed delivery after SROM (spontaneous rupture of membranes)   Postpartum anemia   Short cord complicating labor and delivery   Umbilical cord, velamentous insertion   Postpartum hemorrhage, third stage, delivered   Avulsion of umbilical cord   Retained placenta  Additional problems: Rh negative     Discharge diagnosis: Term Pregnancy Delivered                                                                                                Post partum procedures:rhogam  Augmentation: Pitocin  Complications: None and Hemorrhage>10200mL  Hospital course:  Onset of Labor With Vaginal Delivery     27 y.o. yo G1P0 at 6148w2d was admitted in Latent Labor on 11/20/2017. Patient had an uncomplicated labor course as follows:  Membrane Rupture Time/Date: 3:20 AM ,11/21/2017   Intrapartum Procedures: Episiotomy: None [1]                                         Lacerations:  2nd degree [3];Perineal [11]  Patient had a delivery of a Viable infant. 11/21/2017  Information for the patient's newborn:  Helaine ChessHighfill, Girl [name] [[id number]][[id number]]  Delivery Method: Vag-Spont    Pateint had an uncomplicated postpartum course.  She is ambulating, tolerating a regular diet, passing flatus, and urinating well. Patient is discharged home in stable condition on 11/23/17.   Physical exam  Vitals:   11/22/17 0519 11/22/17 1402 11/22/17 2144 11/23/17 0500  BP: 97/68 97/71 106/74 108/73  Pulse: 91 92 (!) 106 85  Resp: 20 16 18    Temp: 97.9 F (36.6 C) 98 F (36.7 C) 98.4 F (36.9 C) 98.1 F (36.7 C)  TempSrc: Oral Oral Oral Oral  SpO2:  95%    Weight:      Height:       General: alert, cooperative and  no distress Lochia: appropriate Uterine Fundus: firm Incision: perineum well approximated DVT Evaluation: No evidence of DVT seen on physical exam. Labs: Lab Results  Component Value Date   WBC 14.9 (H) 11/22/2017   HGB 8.2 (L) 11/22/2017   HCT 25.4 (L) 11/22/2017   MCV 78.4 11/22/2017   PLT 195 11/22/2017   No flowsheet data found.  Discharge instruction: per After Visit Summary and "Baby and Me Booklet".  After visit meds:  Allergies as of 11/23/2017   No Known Allergies     Medication List    TAKE these medications   ferrous sulfate 325 (65 FE) MG tablet Take 1 tablet (325 mg total) by mouth 2 (two) times daily with a meal.   ibuprofen 600 MG tablet Commonly known as:  ADVIL,MOTRIN Take 1 tablet (600 mg total) by mouth every 6 (six) hours.  sertraline 100 MG tablet Commonly known as:  ZOLOFT Take 1 tablet (100 mg total) by mouth daily.       Diet: routine diet  Activity: Advance as tolerated. Pelvic rest for 6 weeks.   Outpatient follow up:6 weeks Follow up Appt:No future appointments. Follow up Visit:No follow-ups on file.  Postpartum contraception: Condoms  Newborn Data: Live born female  Birth Weight: 8 lb 3.4 oz (3725 g) APGAR: 8, 9  Newborn Delivery   Birth date/time:  11/21/2017 07:59:00 Delivery type:  Vaginal, Spontaneous     Baby Feeding: Breast Disposition:home with mother   11/23/2017 Kenney Houseman, CNM

## 2017-11-23 NOTE — Lactation Note (Signed)
This note was copied from a baby's chart. Lactation Consultation Note: Infant is 5049 hours old and is at 9.3 % weight loss.  Infant has had 8-9 wet and dirty diapers.   Mother has been cluster feeding infant for the last few hours.  FOB reports that infant slept for 4 hours during the night.   Assist with soothing infant . Assist mother with hand expressing  And infant was spoon fed 4-5 ml of colostrum.  Mother taught to do good breast massage and hand express before and after feedings.  Assist mother to sit up in chair with good pillow support.  Infant latched on with pursed lips.  Taught parents to flange infants lips for wide gape.   Mother has a long everted nipple and a very small areola. Infant gets entire nipple in her mouth, but no areola.  Infant is nipple feeding to due length of mothers nipple.  Infant on and off with a few sucks for 10 mins .   Assist mother with alternate breast in football hold. Infant sustained latch on and off for 10-15 mins.   Very few swallows observed.   Mother was sat up with a DEBP by staff nurse.  Observed mother pumping. She obtained 1-2 ml .   Discussed supplementing infant with ebm /formula after each feeding.  Parents were given supplemental guidelines and advised in finger feeding with a curved tip syringe.   Infant took 10 ml of formula and was satisfied . Reviewed plan of care with parents . Mother to pump after each feeding for 15-20 mins . Supplement infant according to supplemental guidelines. Advised to increase supplement with each feeding.   [facility].   Patient Name: Girl Nena Alexandernne Fine ZOXWR'UToday's Date: 11/23/2017 Reason for consult: Follow-up assessment   Maternal Data    Feeding Feeding Type: Breast Fed Length of feed: 15 min(on and off mostly non-nurtive suckling)  LATCH Score Latch: Grasps breast easily, tongue down, lips flanged, rhythmical sucking.  Audible Swallowing: A few  with stimulation  Type of Nipple: Everted at rest and after stimulation  Comfort (Breast/Nipple): Soft / non-tender  Hold (Positioning): Assistance needed to correctly position infant at breast and maintain latch.  LATCH Score: 8  Interventions Interventions: Assisted with latch;Skin to skin;Adjust position;Support pillows;Position options;Expressed milk  Lactation Tools Discussed/Used     Consult Status Consult Status: Follow-up Date: 11/23/17 Follow-up type: In-patient    Stevan BornKendrick, Corisa Montini [facility] 11/23/2017, 10:22 AM

## 2017-11-24 ENCOUNTER — Ambulatory Visit: Payer: Self-pay

## 2017-11-24 NOTE — Lactation Note (Signed)
This note was copied from a baby's chart. Lactation Consultation Note  Patient Name: Michelle [name] NWGNF'AToday's Date: 11/24/2017 Reason for consult: Follow-up assessment;Primapara;1st time breastfeeding;Term;Infant weight loss;Other (Comment)(8% weight loss, LC unable to assess feeding baby recently was fed / mom has significant blood loss )  As LC entered the room, baby asleep in the crib.  Per mom and dad baby recently fed AT 1115 am .  Per mom has been pumping about 5-6 times in the last 24 hours and her breast feel  Toni ArthursFuller, most she has pumped is 5 ml. LC reassured mom  Its normal to be a roller coaster in the  Beginning.  LC reviewed the LC plan due to 8% weight loss and moms significant blood loss.  LC stressed the importance of extra fluids , and plenty Iron rich food, and rest.  Mom is  pale in color.  LC recommended since the baby has a weight check tomorrow , the curved tip syringe  Could be used over night or ideally the 75F SNS when the baby is latched [employer]( Supplies given), ( dad [facility] ). If the tools become a challenge to feed at the breast 1st and then supplement up to 30 ml with a Dr. Manson PasseyBrown nipple.  Mom denies sore ness with latching or with pumping.LC asked RN to obtain the coconut oil for mom.  Sore nipple and engorgement prevention and tx reviewed.  Per mom has DEBP at home.  LC recommended due to baby's weight loss, her blood loss, by Friday to call back  For a LC O/P appt next week to do a feeding assessment and check on milk supply.  Both mom and dad receptive to coming [facility]   Mother informed of post-discharge support and given phone number to the lactation department, including services for phone call assistance; out-patient appointments; and breastfeeding support group. List of other breastfeeding resources in the community given in the handout. Encouraged mother to call for problems or concerns related to  breastfeeding.    Maternal Data    Feeding Feeding Type: Breast Fed(per dad ) Length of feed: 5 min  LATCH Score                   Interventions Interventions: Breast feeding basics reviewed  Lactation Tools Discussed/Used Pump Review: Milk Storage Initiated by:: LC reviewed    Consult Status Consult Status: Follow-up Date: (LC recommended calling Friday for LC alppt. next week for feeding assessment and check on Milk supply ) Follow-up type: Out-patient    Michelle [name] 11/24/2017, 12:51 PM

## 2019-08-15 DIAGNOSIS — R5383 Other fatigue: Secondary | ICD-10-CM | POA: Diagnosis not present

## 2019-08-15 DIAGNOSIS — E559 Vitamin D deficiency, unspecified: Secondary | ICD-10-CM | POA: Diagnosis not present

## 2019-08-15 DIAGNOSIS — E785 Hyperlipidemia, unspecified: Secondary | ICD-10-CM | POA: Diagnosis not present

## 2019-08-21 DIAGNOSIS — F411 Generalized anxiety disorder: Secondary | ICD-10-CM | POA: Diagnosis not present

## 2019-08-21 DIAGNOSIS — Z803 Family history of malignant neoplasm of breast: Secondary | ICD-10-CM | POA: Diagnosis not present

## 2019-08-21 DIAGNOSIS — R6882 Decreased libido: Secondary | ICD-10-CM | POA: Diagnosis not present

## 2019-08-21 DIAGNOSIS — Z124 Encounter for screening for malignant neoplasm of cervix: Secondary | ICD-10-CM | POA: Diagnosis not present

## 2019-08-21 DIAGNOSIS — Z800 Family history of malignant neoplasm of digestive organs: Secondary | ICD-10-CM | POA: Diagnosis not present

## 2019-08-21 DIAGNOSIS — Z01419 Encounter for gynecological examination (general) (routine) without abnormal findings: Secondary | ICD-10-CM | POA: Diagnosis not present

## 2019-08-21 DIAGNOSIS — E785 Hyperlipidemia, unspecified: Secondary | ICD-10-CM | POA: Diagnosis not present

## 2020-01-23 DIAGNOSIS — F411 Generalized anxiety disorder: Secondary | ICD-10-CM | POA: Diagnosis not present

## 2020-01-23 DIAGNOSIS — R6882 Decreased libido: Secondary | ICD-10-CM | POA: Diagnosis not present

## 2020-07-24 DIAGNOSIS — F411 Generalized anxiety disorder: Secondary | ICD-10-CM | POA: Diagnosis not present

## 2020-07-24 DIAGNOSIS — R6882 Decreased libido: Secondary | ICD-10-CM | POA: Diagnosis not present

## 2020-07-24 DIAGNOSIS — R5383 Other fatigue: Secondary | ICD-10-CM | POA: Diagnosis not present

## 2020-07-24 DIAGNOSIS — E785 Hyperlipidemia, unspecified: Secondary | ICD-10-CM | POA: Diagnosis not present

## 2020-08-19 DIAGNOSIS — R5383 Other fatigue: Secondary | ICD-10-CM | POA: Diagnosis not present

## 2020-08-19 DIAGNOSIS — E785 Hyperlipidemia, unspecified: Secondary | ICD-10-CM | POA: Diagnosis not present

## 2020-09-24 DIAGNOSIS — R6882 Decreased libido: Secondary | ICD-10-CM | POA: Diagnosis not present

## 2020-09-24 DIAGNOSIS — L299 Pruritus, unspecified: Secondary | ICD-10-CM | POA: Diagnosis not present

## 2020-09-24 DIAGNOSIS — F411 Generalized anxiety disorder: Secondary | ICD-10-CM | POA: Diagnosis not present

## 2020-09-24 DIAGNOSIS — Z0000 Encounter for general adult medical examination without abnormal findings: Secondary | ICD-10-CM | POA: Diagnosis not present

## 2021-05-22 DIAGNOSIS — B349 Viral infection, unspecified: Secondary | ICD-10-CM | POA: Diagnosis not present

## 2021-05-22 DIAGNOSIS — Z03818 Encounter for observation for suspected exposure to other biological agents ruled out: Secondary | ICD-10-CM | POA: Diagnosis not present

## 2021-05-22 DIAGNOSIS — K529 Noninfective gastroenteritis and colitis, unspecified: Secondary | ICD-10-CM | POA: Diagnosis not present

## 2021-06-09 ENCOUNTER — Other Ambulatory Visit: Payer: Self-pay

## 2021-06-09 ENCOUNTER — Emergency Department (HOSPITAL_BASED_OUTPATIENT_CLINIC_OR_DEPARTMENT_OTHER): Payer: BC Managed Care – PPO | Admitting: Radiology

## 2021-06-09 DIAGNOSIS — Z20822 Contact with and (suspected) exposure to covid-19: Secondary | ICD-10-CM | POA: Diagnosis not present

## 2021-06-09 DIAGNOSIS — R509 Fever, unspecified: Secondary | ICD-10-CM | POA: Diagnosis not present

## 2021-06-09 DIAGNOSIS — J029 Acute pharyngitis, unspecified: Secondary | ICD-10-CM | POA: Diagnosis not present

## 2021-06-09 DIAGNOSIS — Z5321 Procedure and treatment not carried out due to patient leaving prior to being seen by health care provider: Secondary | ICD-10-CM | POA: Insufficient documentation

## 2021-06-09 DIAGNOSIS — R051 Acute cough: Secondary | ICD-10-CM | POA: Diagnosis not present

## 2021-06-09 LAB — RESP PANEL BY RT-PCR (FLU A&B, COVID) ARPGX2
Influenza A by PCR: NEGATIVE
Influenza B by PCR: NEGATIVE
SARS Coronavirus 2 by RT PCR: NEGATIVE

## 2021-06-09 LAB — GROUP A STREP BY PCR: Group A Strep by PCR: NOT DETECTED

## 2021-06-09 NOTE — ED Triage Notes (Signed)
C/O cough, sore throat, chills and fever x 24 hrs.  ?

## 2021-06-10 ENCOUNTER — Emergency Department (HOSPITAL_BASED_OUTPATIENT_CLINIC_OR_DEPARTMENT_OTHER)
Admission: EM | Admit: 2021-06-10 | Discharge: 2021-06-10 | Discharge status: Against Medical Advice | Payer: BC Managed Care – PPO | Attending: Emergency Medicine | Admitting: Emergency Medicine

## 2021-06-10 DIAGNOSIS — Z03818 Encounter for observation for suspected exposure to other biological agents ruled out: Secondary | ICD-10-CM | POA: Diagnosis not present

## 2021-06-10 DIAGNOSIS — J029 Acute pharyngitis, unspecified: Secondary | ICD-10-CM | POA: Diagnosis not present

## 2021-06-10 DIAGNOSIS — B349 Viral infection, unspecified: Secondary | ICD-10-CM | POA: Diagnosis not present

## 2021-08-07 DIAGNOSIS — F411 Generalized anxiety disorder: Secondary | ICD-10-CM | POA: Diagnosis not present

## 2021-08-07 DIAGNOSIS — E785 Hyperlipidemia, unspecified: Secondary | ICD-10-CM | POA: Diagnosis not present

## 2021-08-07 DIAGNOSIS — R6882 Decreased libido: Secondary | ICD-10-CM | POA: Diagnosis not present

## 2021-08-07 DIAGNOSIS — F429 Obsessive-compulsive disorder, unspecified: Secondary | ICD-10-CM | POA: Diagnosis not present

## 2021-10-21 DIAGNOSIS — F429 Obsessive-compulsive disorder, unspecified: Secondary | ICD-10-CM | POA: Diagnosis not present

## 2021-10-21 DIAGNOSIS — Z0000 Encounter for general adult medical examination without abnormal findings: Secondary | ICD-10-CM | POA: Diagnosis not present

## 2021-10-21 DIAGNOSIS — E785 Hyperlipidemia, unspecified: Secondary | ICD-10-CM | POA: Diagnosis not present

## 2021-10-21 DIAGNOSIS — Z1329 Encounter for screening for other suspected endocrine disorder: Secondary | ICD-10-CM | POA: Diagnosis not present

## 2021-10-21 DIAGNOSIS — Z79899 Other long term (current) drug therapy: Secondary | ICD-10-CM | POA: Diagnosis not present

## 2021-10-21 DIAGNOSIS — R6882 Decreased libido: Secondary | ICD-10-CM | POA: Diagnosis not present

## 2021-10-21 DIAGNOSIS — F411 Generalized anxiety disorder: Secondary | ICD-10-CM | POA: Diagnosis not present

## 2021-10-21 DIAGNOSIS — Z13228 Encounter for screening for other metabolic disorders: Secondary | ICD-10-CM | POA: Diagnosis not present

## 2022-07-01 IMAGING — DX DG CHEST 2V
2 series · 2 of 2 positions shown · non-contrast
Comparison: None.

CLINICAL DATA: Sore throat.

EXAM:
CHEST - 2 VIEW

[chest pa]
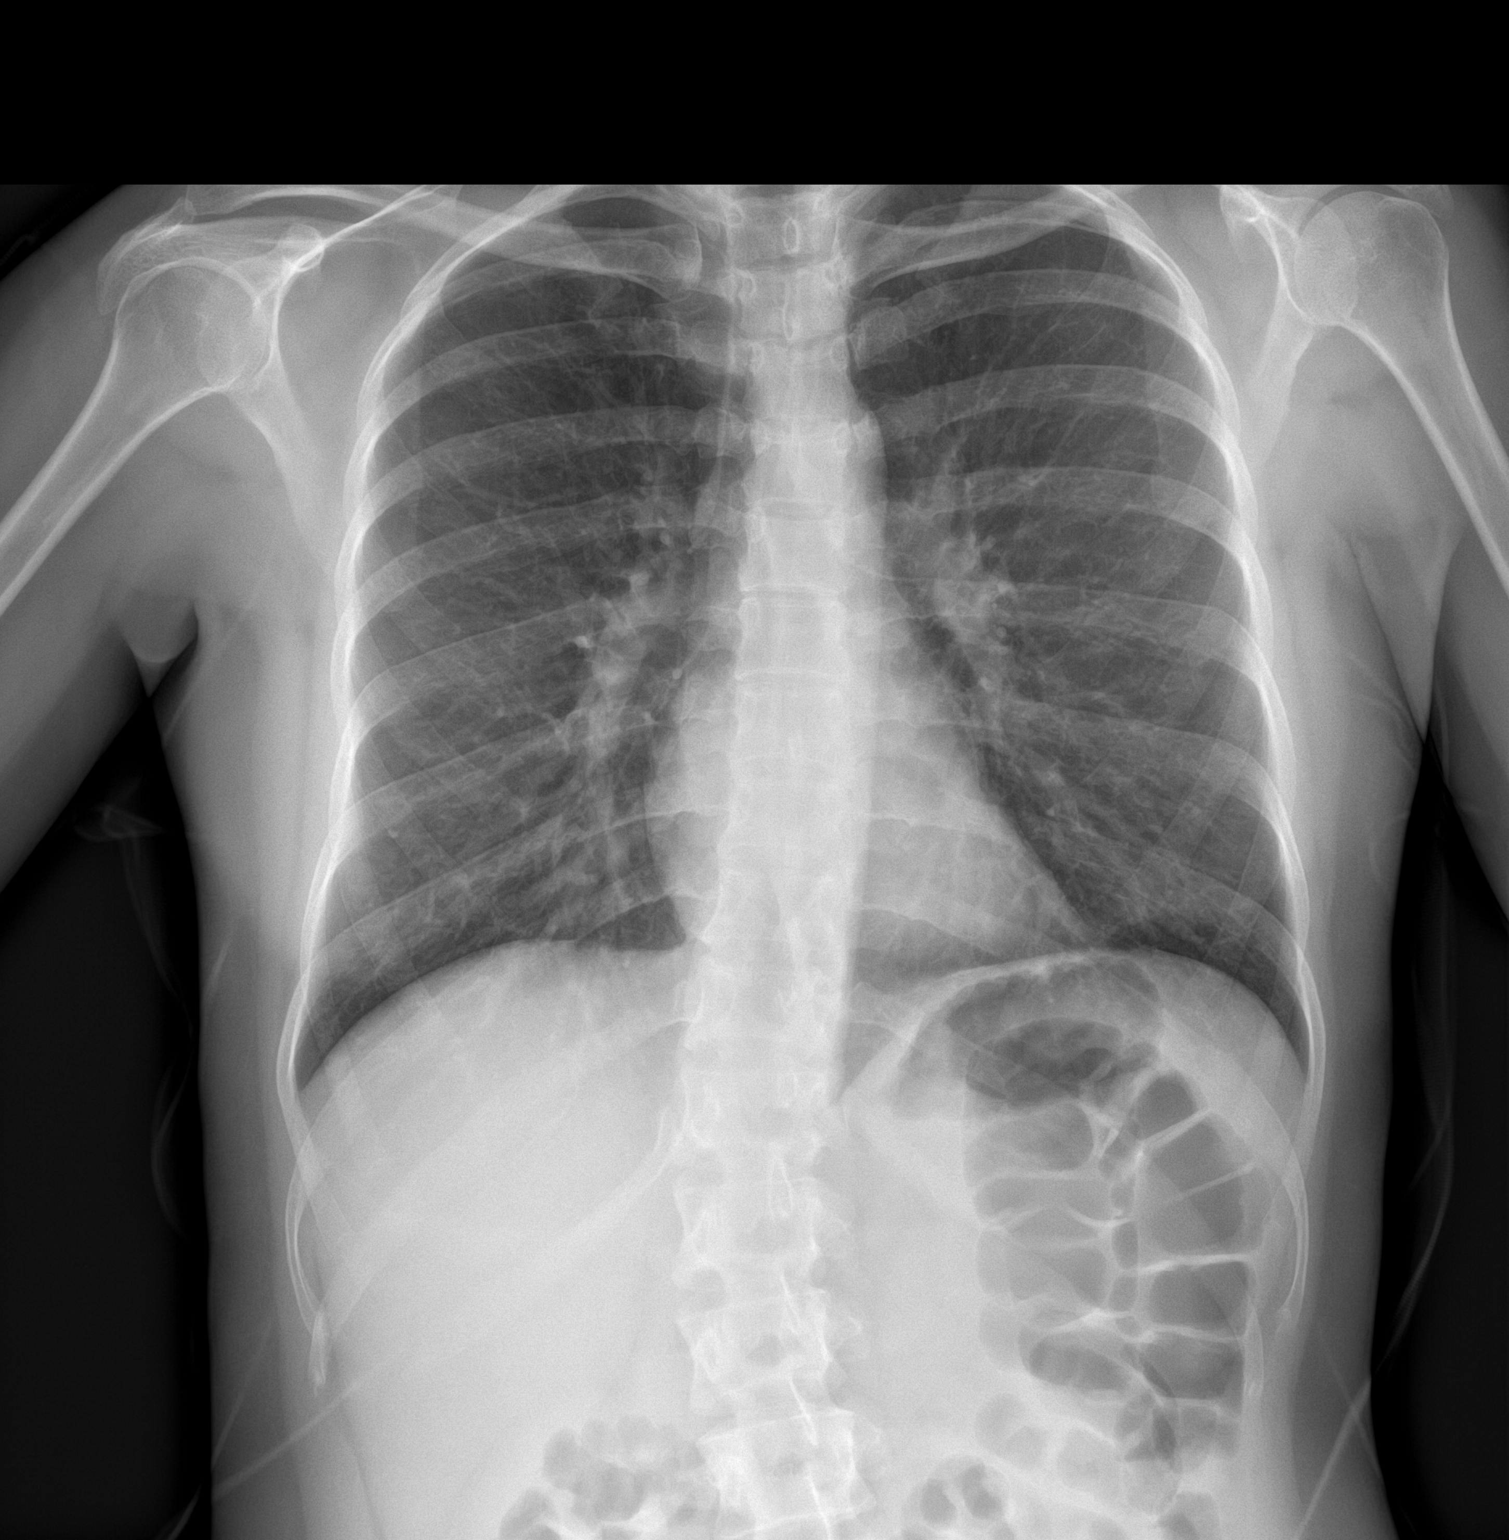

[chest lat]
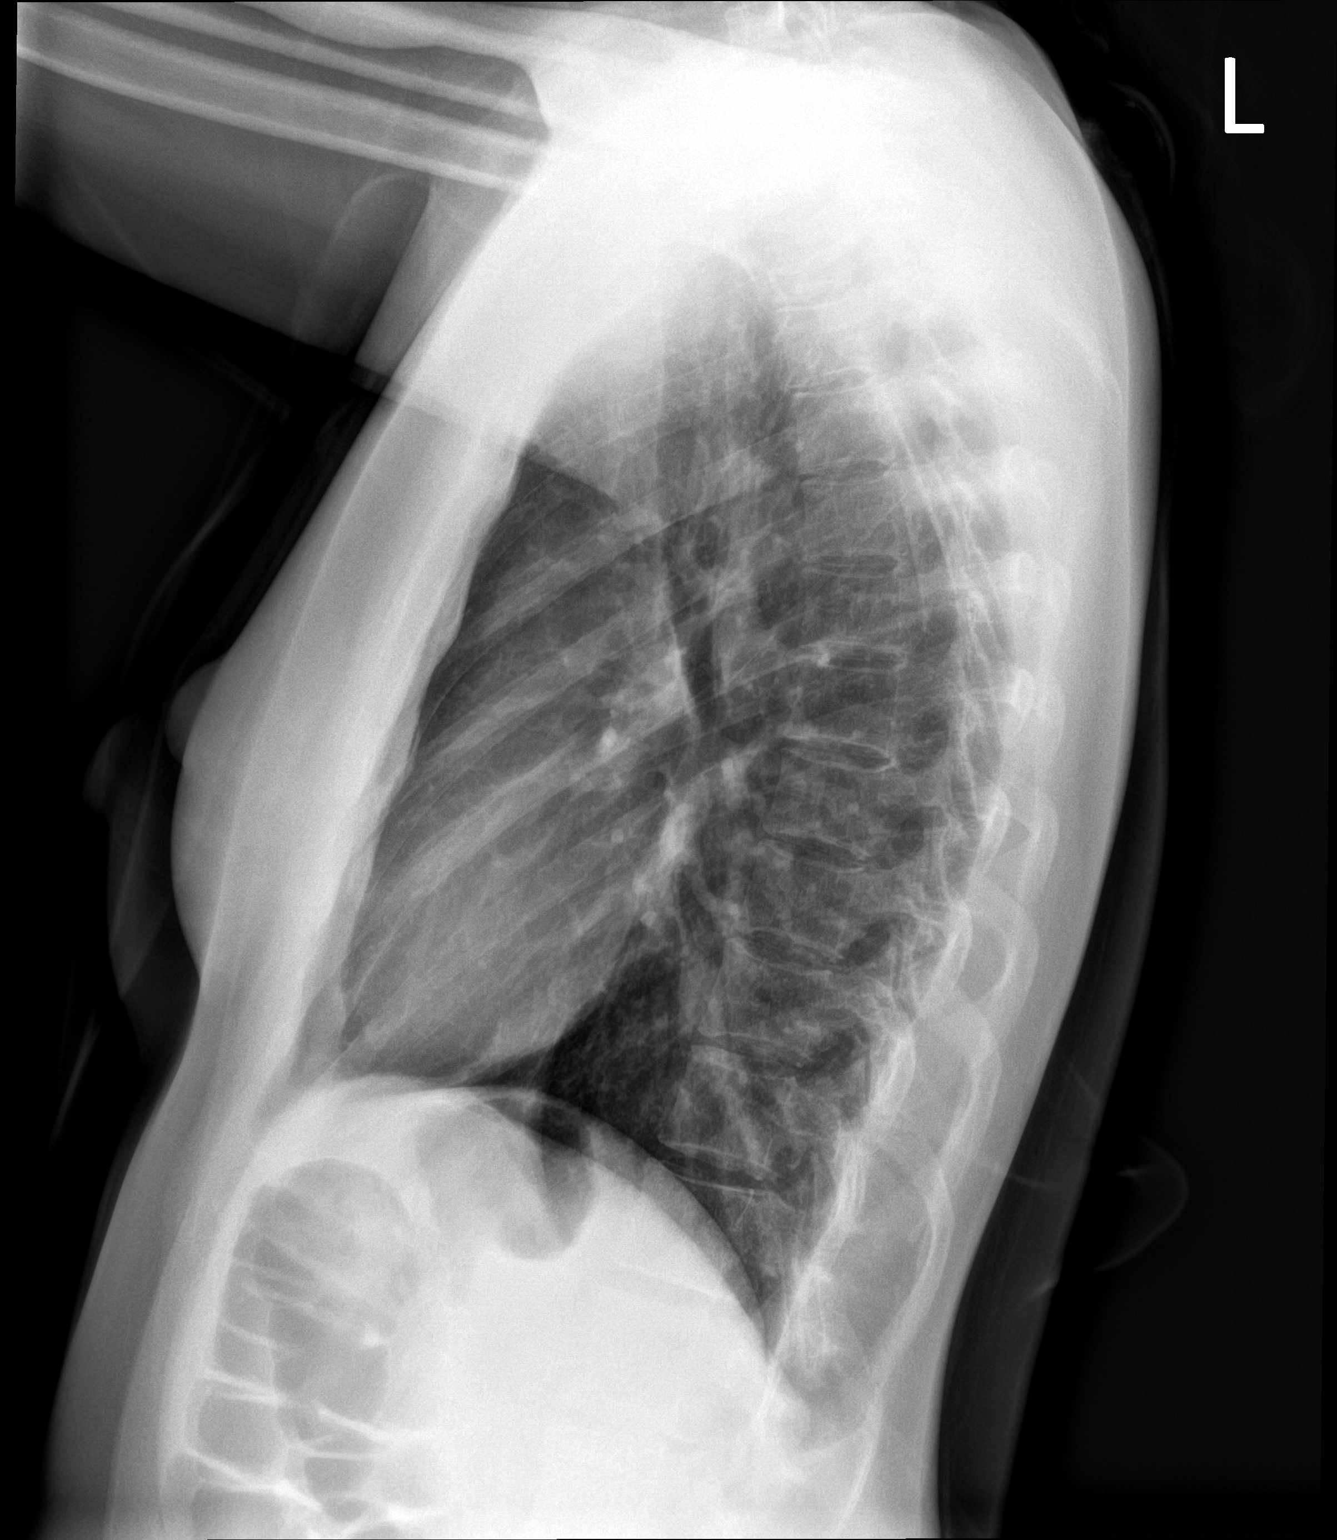

[2 of 2 positions shown; findings below may reference images not displayed]

FINDINGS: The heart size and mediastinal contours are within normal limits.
Both lungs are clear. The visualized skeletal structures are
unremarkable.
IMPRESSION: No active cardiopulmonary disease.

## 2024-02-07 ENCOUNTER — Encounter (HOSPITAL_BASED_OUTPATIENT_CLINIC_OR_DEPARTMENT_OTHER): Payer: Self-pay

## 2024-02-07 ENCOUNTER — Emergency Department (HOSPITAL_BASED_OUTPATIENT_CLINIC_OR_DEPARTMENT_OTHER)
Admission: EM | Admit: 2024-02-07 | Discharge: 2024-02-07 | Disposition: A | Discharge status: Home / Self Care | Attending: Emergency Medicine | Admitting: Emergency Medicine

## 2024-02-07 ENCOUNTER — Emergency Department (HOSPITAL_BASED_OUTPATIENT_CLINIC_OR_DEPARTMENT_OTHER)

## 2024-02-07 ENCOUNTER — Other Ambulatory Visit: Payer: Self-pay

## 2024-02-07 DIAGNOSIS — W109XXA Fall (on) (from) unspecified stairs and steps, initial encounter: Secondary | ICD-10-CM | POA: Diagnosis not present

## 2024-02-07 DIAGNOSIS — S060X0A Concussion without loss of consciousness, initial encounter: Secondary | ICD-10-CM | POA: Diagnosis not present

## 2024-02-07 DIAGNOSIS — Y9301 Activity, walking, marching and hiking: Secondary | ICD-10-CM | POA: Diagnosis not present

## 2024-02-07 DIAGNOSIS — S0990XA Unspecified injury of head, initial encounter: Secondary | ICD-10-CM | POA: Diagnosis present

## 2024-02-07 DIAGNOSIS — M542 Cervicalgia: Secondary | ICD-10-CM | POA: Insufficient documentation

## 2024-02-07 NOTE — ED Provider Notes (Signed)
 [facility] Provider Note   CSN: [id number] Arrival date & time: 02/07/24  1458     Patient presents with: Head Injury   Michelle [name] is a 33 y.o. female with no significant past medical history presents with concern for a fall that occurred earlier today.  States she was walking down the stairs when she slipped and fell onto the posterior aspect of her head.  She fell down approximately 3 stairs.  Denies any loss of consciousness.  Reports that since this fall, she has had a headache in the posterior left side of her skull where she hit her head as well as a frontal headache. She also reports some mild dizziness.  No double vision.  She denies any nausea, vomiting, visual changes.  No photosensitivity or sensitivity.  She does report some mild pain in her neck as well.  Denies any numbness or tingling in the upper or lower extremities bilaterally.  She is not on any anticoagulation.    Head Injury Associated symptoms: headache        Prior to Admission medications   Medication Sig Start Date End Date Taking? Authorizing Provider  buPROPion  (WELLBUTRIN  XL) 300 MG 24 hr tablet Take 300 mg by mouth every morning.    [provider]  ezetimibe (ZETIA) 10 MG tablet Take 10 mg by mouth daily.    [provider]  famotidine (PEPCID) 20 MG tablet Take 20 mg by mouth daily.    [provider]  ferrous sulfate  325 (65 FE) MG tablet Take 1 tablet (325 mg total) by mouth 2 (two) times daily with a meal. 11/23/17   Prothero, Inocente Gunner, CNM  ibuprofen  (ADVIL ,MOTRIN ) 600 MG tablet Take 1 tablet (600 mg total) by mouth every 6 (six) hours. 11/23/17   Prothero, Inocente Gunner, CNM  sertraline  (ZOLOFT ) 100 MG tablet Take 1 tablet (100 mg total) by mouth daily. 08/14/13 11/20/17  Zanard, Robyn K, MD    Allergies: Patient has no known allergies.    Review of Systems  Neurological:  Positive for headaches.    Updated Vital Signs BP  111/89   Pulse 91   Temp 98.2 F (36.8 C) (Oral)   Resp 17   LMP 02/06/2024 (Exact Date)   SpO2 100%   Physical Exam Vitals and nursing note reviewed.  Constitutional:      Appearance: Normal appearance.  HENT:     Head: Normocephalic and atraumatic.      Comments: Mild tenderness to palpation of the left occipital region.  No overlying hematomas, abrasions, lacerations    Eyes:     Extraocular Movements: Extraocular movements intact.     Pupils: Pupils are equal, round, and reactive to light.  Cardiovascular:     Rate and Rhythm: Normal rate and regular rhythm.  Pulmonary:     Effort: Pulmonary effort is normal.     Breath sounds: Normal breath sounds.  Musculoskeletal:     Cervical back: Normal range of motion.     Comments: Mild tenderness palpation of the upper cervical spine.  No tenderness of the thoracic or lumbar spine.  Moves arms and legs without difficulty.  No tenderness to palpation of the upper or lower extremities diffusely.  Ambulates without difficulty.  Neurological:     General: No focal deficit present.     Mental Status: She is alert and oriented to person, place, and time.  Psychiatric:        Mood and Affect:  Mood normal.        Behavior: Behavior normal.     (all labs ordered are listed, but only abnormal results are displayed) Labs Reviewed - No data to display  EKG: None  Radiology: CT Head Wo Contrast Result Date: 02/07/2024 CLINICAL DATA:  Slip and fall on stairs, hit back of head, persistent dizziness and headache, neck pain EXAM: CT HEAD WITHOUT CONTRAST CT CERVICAL SPINE WITHOUT CONTRAST TECHNIQUE: Multidetector CT imaging of the head and cervical spine was performed following the standard protocol without intravenous contrast. Multiplanar CT image reconstructions of the cervical spine were also generated. RADIATION DOSE REDUCTION: This exam was performed according to the departmental dose-optimization program which includes automated  exposure control, adjustment of the mA and/or kV according to patient size and/or use of iterative reconstruction technique. COMPARISON:  None Available. FINDINGS: CT HEAD FINDINGS Brain: No evidence of acute infarction, hemorrhage, hydrocephalus, extra-axial collection or mass lesion/mass effect. Vascular: No hyperdense vessel or unexpected calcification. Skull: Normal. Negative for fracture or focal lesion. Sinuses/Orbits: No acute finding. Other: None. CT CERVICAL SPINE FINDINGS Alignment: Positional straightening of the normal cervical lordosis. Skull base and vertebrae: No acute fracture. No primary bone lesion or focal pathologic process. Soft tissues and spinal canal: No prevertebral fluid or swelling. No visible canal hematoma. Disc levels:  Intact. Upper chest: Negative. Other: None. IMPRESSION: 1. No acute intracranial pathology. 2. No fracture or static subluxation of the cervical spine. Electronically Signed   By: Marolyn JONETTA Jaksch M.D.   On: 02/07/2024 16:16   CT Cervical Spine Wo Contrast Result Date: 02/07/2024 CLINICAL DATA:  Slip and fall on stairs, hit back of head, persistent dizziness and headache, neck pain EXAM: CT HEAD WITHOUT CONTRAST CT CERVICAL SPINE WITHOUT CONTRAST TECHNIQUE: Multidetector CT imaging of the head and cervical spine was performed following the standard protocol without intravenous contrast. Multiplanar CT image reconstructions of the cervical spine were also generated. RADIATION DOSE REDUCTION: This exam was performed according to the departmental dose-optimization program which includes automated exposure control, adjustment of the mA and/or kV according to patient size and/or use of iterative reconstruction technique. COMPARISON:  None Available. FINDINGS: CT HEAD FINDINGS Brain: No evidence of acute infarction, hemorrhage, hydrocephalus, extra-axial collection or mass lesion/mass effect. Vascular: No hyperdense vessel or unexpected calcification. Skull: Normal. Negative  for fracture or focal lesion. Sinuses/Orbits: No acute finding. Other: None. CT CERVICAL SPINE FINDINGS Alignment: Positional straightening of the normal cervical lordosis. Skull base and vertebrae: No acute fracture. No primary bone lesion or focal pathologic process. Soft tissues and spinal canal: No prevertebral fluid or swelling. No visible canal hematoma. Disc levels:  Intact. Upper chest: Negative. Other: None. IMPRESSION: 1. No acute intracranial pathology. 2. No fracture or static subluxation of the cervical spine. Electronically Signed   By: Marolyn JONETTA Jaksch M.D.   On: 02/07/2024 16:16     Procedures   Medications Ordered in the ED - No data to display                                  Medical Decision Making Amount and/or Complexity of Data Reviewed Radiology: ordered.     Differential diagnosis includes but is not limited to intracranial hemorrhage, skull fracture, concussion  ED Course:  Upon initial evaluation, patient is well-appearing, no acute distress.  Normal vital signs.  No neurologic deficits on exam.  No obvious signs of head trauma; no hematomas, lacerations, abrasions,  ecchymoses.  She does have some mild cervical spinal tenderness to palpation occiput.  Given ongoing headache and some mild dizziness, will obtain CT head to rule out skull fracture or intracranial hemorrhage.  Patient declines any medicine for her headache at this time.   Imaging Studies ordered: I ordered imaging studies including CT head, CT cervical spine I independently visualized the imaging with scope of interpretation limited to determining acute life threatening conditions related to emergency care. Imaging showed no acute abnormalities I agree with the radiologist interpretation  Medications Given: None  Upon re-evaluation, patient remains well-appearing with stable vitals.  I discussed with patient that her CT head and cervical spine are reassuring.  No evidence of intracranial  hemorrhage, skull fracture, or cervical spine injury.  Her exam and workup today is consistent with a concussion.  We discussed concussion precautions.  Patient stable and appropriate for discharge home.    Impression: Concussion  Disposition:  The patient was discharged home with instructions to refrain from any strenuous mental activities for the next 48 hours, decrease screen time, get good sleep.  Tylenol  and ibuprofen  for headaches.  Up with the concussion clinic if symptoms not improving within the next week. Return precautions given and patient verbalized understanding.    This chart was dictated using voice recognition software, Dragon. Despite the best efforts of this provider to proofread and correct errors, errors may still occur which can change documentation meaning.       Final diagnoses:  Concussion without loss of consciousness, initial encounter    ED Discharge Orders     None          Veta Palma, [place] 02/07/24 1641    Long, Fonda MATSU, MD 02/07/24 1718

## 2024-02-07 NOTE — Discharge Instructions (Addendum)
 Your CT of the head and neck was reassuring today.  No signs of a fracture, dislocation, or brain bleed.  You appear to have a concussion which is a state of changed mental ability from trauma.  Refrain from any strenuous physical activities or any activities that require lots of focus for the next 48 hours. Decrease your screen time (time on phones, TV, laptop, etc) to no more than 30 minutes per day while symptoms persist and get at least 8 hours of sleep at night.   You may engage in light physical activity (such as walking) as long as it does not exacerbate your symptoms.  You may return to work/school as tolerated.  Medications: You may take up to 1000mg  of tylenol  every 6 hours as needed for pain.  Do not take more then 4g per day.  You may use up to 600mg  ibuprofen  every 6 hours as needed for pain.  Do not exceed 2.4g of ibuprofen  per day.  Return to the ER if: There is severe confusion or drowsiness You cannot awaken the injured person.  You have repetitive vomiting   You notice dizziness or unsteadiness which is getting worse, or inability to walk.  You lose consciousness You experience severe, persistent headaches not relieved by Tylenol  or ibuprofen  You cannot use arms or legs normally.  There are changes in pupil sizes. (This is the black center in the colored part of the eye)  You have changes in your vision There is clear or bloody discharge from the nose or ears.  Change in speech, vision, swallowing, or understanding.  Localized weakness, numbness, tingling, or change in bowel or bladder control. Any other new or concerning symptoms

## 2024-02-07 NOTE — ED Triage Notes (Signed)
 Reports slipping on stairs, approx 3 stairs. Hit back of head. Dizziness, headache since. Denies N/V, visual changes. Reports pain in neck  No LOC, no blood thinners Ambulatory, A&Ox4
